# Patient Record
Sex: Male | Born: 1980 | Race: White | Hispanic: No | Marital: Single | Smoking: Never smoker
Health system: Southern US, Community
[De-identification: ages and names within clinical notes are randomized; demographics above are authoritative.]

## PROBLEM LIST (undated history)

## (undated) DIAGNOSIS — I10 Essential (primary) hypertension: Secondary | ICD-10-CM

## (undated) DIAGNOSIS — F1010 Alcohol abuse, uncomplicated: Secondary | ICD-10-CM

## (undated) DIAGNOSIS — F1910 Other psychoactive substance abuse, uncomplicated: Secondary | ICD-10-CM

## (undated) HISTORY — DX: Essential (primary) hypertension: I10

## (undated) HISTORY — PX: NO PAST SURGERIES: SHX2092

---

## 2008-05-13 ENCOUNTER — Emergency Department: Payer: Self-pay | Admitting: Emergency Medicine

## 2015-05-02 ENCOUNTER — Emergency Department: Payer: Self-pay

## 2015-05-02 ENCOUNTER — Emergency Department
Admission: EM | Admit: 2015-05-02 | Discharge: 2015-05-02 | Disposition: A | Discharge status: Home / Self Care | Payer: Self-pay | Attending: Emergency Medicine | Admitting: Emergency Medicine

## 2015-05-02 ENCOUNTER — Encounter: Payer: Self-pay | Admitting: Emergency Medicine

## 2015-05-02 DIAGNOSIS — X58XXXA Exposure to other specified factors, initial encounter: Secondary | ICD-10-CM | POA: Insufficient documentation

## 2015-05-02 DIAGNOSIS — Y9389 Activity, other specified: Secondary | ICD-10-CM | POA: Insufficient documentation

## 2015-05-02 DIAGNOSIS — Y998 Other external cause status: Secondary | ICD-10-CM | POA: Insufficient documentation

## 2015-05-02 DIAGNOSIS — S92355A Nondisplaced fracture of fifth metatarsal bone, left foot, initial encounter for closed fracture: Secondary | ICD-10-CM | POA: Insufficient documentation

## 2015-05-02 DIAGNOSIS — Y9289 Other specified places as the place of occurrence of the external cause: Secondary | ICD-10-CM | POA: Insufficient documentation

## 2015-05-02 DIAGNOSIS — S92301A Fracture of unspecified metatarsal bone(s), right foot, initial encounter for closed fracture: Secondary | ICD-10-CM

## 2015-05-02 MED ORDER — IBUPROFEN 800 MG PO TABS
800.0000 mg | ORAL_TABLET | Freq: Once | ORAL | Status: AC
  Administered 2015-05-02: 800 mg via ORAL
  Filled 2015-05-02: qty 1

## 2015-05-02 MED ORDER — TRAMADOL HCL 50 MG PO TABS: 50.0000 mg | ORAL_TABLET | Freq: Four times a day (QID) | ORAL | Status: DC | PRN

## 2015-05-02 MED ORDER — TRAMADOL HCL 50 MG PO TABS
50.0000 mg | ORAL_TABLET | Freq: Once | ORAL | Status: AC
  Administered 2015-05-02: 50 mg via ORAL
  Filled 2015-05-02: qty 1

## 2015-05-02 MED ORDER — IBUPROFEN 800 MG PO TABS: 800.0000 mg | ORAL_TABLET | Freq: Three times a day (TID) | ORAL | Status: DC | PRN

## 2015-05-02 NOTE — ED Notes (Signed)
States he felt a pop to left foot last pm conts with pain to day

## 2015-05-02 NOTE — ED Provider Notes (Signed)
[facility] Emergency Department Provider Note  ____________________________________________  Time seen: Approximately 1:48 PM  I have reviewed the triage vital signs and the nursing notes.   HISTORY  Chief Complaint Foot Pain    HPI Joseff Luckman is a 34 y.o. male patient complaining of left lateral foot pain since last night. Patient states he felt a pop sensation in the lateral aspect of his foot and since then he's been unable to ambulate without discomfort. Patient state he is able to walk on his heel and any pressure on the lateral aspect of his foot causes increased pain. Patient rates his pain as a 5/10 with ambulation. Described as sharp.   History reviewed. No pertinent past medical history.  There are no active problems to display for this patient.   History reviewed. No pertinent past surgical history.  No current outpatient prescriptions on file.  Allergies Review of patient's allergies indicates no known allergies.  No family history on file.  Social History Social History  Substance Use Topics  . Smoking status: Never Smoker   . Smokeless tobacco: None  . Alcohol Use: No    Review of Systems Constitutional: No fever/chills Eyes: No visual changes. ENT: No sore throat. Cardiovascular: Denies chest pain. Respiratory: Denies shortness of breath. Gastrointestinal: No abdominal pain.  No nausea, no vomiting.  No diarrhea.  No constipation. Genitourinary: Negative for dysuria. Musculoskeletal: Left lateral foot pain. Skin: Negative for rash. Neurological: Negative for headaches, focal weakness or numbness. 10-point ROS otherwise negative.  ____________________________________________   PHYSICAL EXAM:  VITAL SIGNS: ED Triage Vitals  Enc Vitals Group     BP --      Pulse Rate 05/02/15 1340 65     Resp 05/02/15 1340 20     Temp 05/02/15 1340 98 F (36.7 C)     Temp Source 05/02/15 1340 Oral     SpO2 05/02/15 1340 100 %    Weight 05/02/15 1340 165 lb (74.844 kg)     Height 05/02/15 1340  (1.803 m)     Head Cir --      Peak Flow --      Pain Score 05/02/15 1339 5     Pain Loc --      Pain Edu? --      Excl. in GC? --     Constitutional: Alert and oriented. Well appearing and in no acute distress. Eyes: Conjunctivae are normal. PERRL. EOMI. Head: Atraumatic. Nose: No congestion/rhinnorhea. Mouth/Throat: Mucous membranes are moist.  Oropharynx non-erythematous. Neck: No stridor.   Hematological/Lymphatic/Immunilogical: No cervical lymphadenopathy. Cardiovascular: Normal rate, regular rhythm. Grossly normal heart sounds.  Good peripheral circulation. Respiratory: Normal respiratory effort.  No retractions. Lungs CTAB. Gastrointestinal: Soft and nontender. No distention. No abdominal bruits. No CVA tenderness. Musculoskeletal: No obvious deformity. Moderate guarding palpation of the lateral fifth metatarsal head. Patient ambulated atypical gait favoring the left foot.  Neurologic:  Normal speech and language. No gross focal neurologic deficits are appreciated. No gait instability. Skin:  Skin is warm, dry and intact. No rash noted. Psychiatric: Mood and affect are normal. Speech and behavior are normal.  ____________________________________________   LABS (all labs ordered are listed, but only abnormal results are displayed)  Labs Reviewed - No data to display ____________________________________________  EKG   ____________________________________________  RADIOLOGY  Nondisplaced fractured fifth metatarsal left foot. I, Joni Reining, personally viewed and evaluated these images (plain radiographs) as part of my medical decision making.   ____________________________________________   PROCEDURES  Procedure(s) performed: None  Critical Care performed: No  ____________________________________________   INITIAL IMPRESSION / ASSESSMENT AND PLAN / ED COURSE  Pertinent labs &  imaging results that were available during my care of the patient were reviewed by me and considered in my medical decision making (see chart for details).  Nondisplaced left fifth metatarsal fracture. Discussed x-ray findings with patient. Patient placed in an open shoe. Patient given prescription for tramadol and ibuprofen. Patient advised to follow orthopedics in 3-5 days. ____________________________________________   FINAL CLINICAL IMPRESSION(S) / ED DIAGNOSES  Final diagnoses:  Metatarsal fracture, right, closed, initial encounter      Joni Reining, PA-C 05/02/15 1443  Joni Reining, PA-C 05/02/15 1444  Phineas Semen, MD 05/02/15 [phone]

## 2015-05-02 NOTE — ED Notes (Signed)
Felt a pop to top of foot while at work last pm. States pain is worse this am   Min swelling noted

## 2015-05-02 NOTE — Discharge Instructions (Signed)
Wear open shoe until evaluation by Ortho Clinic.

## 2016-03-10 ENCOUNTER — Emergency Department: Payer: Self-pay

## 2016-03-10 ENCOUNTER — Encounter: Payer: Self-pay | Admitting: Emergency Medicine

## 2016-03-10 ENCOUNTER — Emergency Department
Admission: EM | Admit: 2016-03-10 | Discharge: 2016-03-10 | Disposition: A | Discharge status: Home / Self Care | Payer: Self-pay | Attending: Emergency Medicine | Admitting: Emergency Medicine

## 2016-03-10 DIAGNOSIS — Y999 Unspecified external cause status: Secondary | ICD-10-CM | POA: Insufficient documentation

## 2016-03-10 DIAGNOSIS — S93402A Sprain of unspecified ligament of left ankle, initial encounter: Secondary | ICD-10-CM | POA: Insufficient documentation

## 2016-03-10 DIAGNOSIS — Y9289 Other specified places as the place of occurrence of the external cause: Secondary | ICD-10-CM | POA: Insufficient documentation

## 2016-03-10 DIAGNOSIS — X509XXA Other and unspecified overexertion or strenuous movements or postures, initial encounter: Secondary | ICD-10-CM | POA: Insufficient documentation

## 2016-03-10 DIAGNOSIS — Y9389 Activity, other specified: Secondary | ICD-10-CM | POA: Insufficient documentation

## 2016-03-10 MED ORDER — IBUPROFEN 800 MG PO TABS: 800.0000 mg | ORAL_TABLET | Freq: Three times a day (TID) | ORAL | 0 refills | Status: DC | PRN

## 2016-03-10 NOTE — ED Triage Notes (Signed)
Pt reports left ankle pain x1 month, reports he stepped off a curb and pain has not improved.

## 2016-03-10 NOTE — ED Notes (Signed)
Pt was not in room at time for discharge and did not notify staff leaving.

## 2016-03-10 NOTE — ED Provider Notes (Signed)
[facility] Emergency Department Provider Note  ____________________________________________  Time seen: Approximately 11:06 AM  I have reviewed the triage vital signs and the nursing notes.   HISTORY  Chief Complaint Ankle Pain    HPI Billy Wood is a 35 y.o. male presents for evaluation of left ankle pain 1 month. Patient states that he stepped off a curb but denied any insurance to get his ankle checked at that time. Complains of still having swelling and tenderness to the left ankle.   History reviewed. No pertinent past medical history.  There are no active problems to display for this patient.   History reviewed. No pertinent surgical history.  Prior to Admission medications   Medication Sig Start Date End Date Taking? Authorizing Provider  ibuprofen (ADVIL,MOTRIN) 800 MG tablet Take 1 tablet (800 mg total) by mouth every 8 (eight) hours as needed. 03/10/16   Evangeline Dakin, PA-C    Allergies Review of patient's allergies indicates no known allergies.  No family history on file.  Social History Social History  Substance Use Topics  . Smoking status: Never Smoker  . Smokeless tobacco: Not on file  . Alcohol use Yes     Comment: occassional     Review of Systems Constitutional: No fever/chills Cardiovascular: Denies chest pain. Respiratory: Denies shortness of breath. Musculoskeletal: Positive for left ankle pain. Skin: Negative for rash. Neurological: Negative for headaches, focal weakness or numbness.  10-point ROS otherwise negative.  ____________________________________________   PHYSICAL EXAM:  VITAL SIGNS: ED Triage Vitals [03/10/16 1053]  Enc Vitals Group     BP 127/90     Pulse Rate 75     Resp 18     Temp 97.9 F (36.6 C)     Temp Source Oral     SpO2 99 %     Weight 165 lb (74.8 kg)     Height 5\' 11"  (1.803 m)     Head Circumference      Peak Flow      Pain Score      Pain Loc      Pain Edu?      Excl.  in GC?     Constitutional: Alert and oriented. Well appearing and in no acute distress.  Cardiovascular: Normal rate, regular rhythm. Grossly normal heart sounds.  Good peripheral circulation. Respiratory: Normal respiratory effort.  No retractions. Lungs CTAB. Musculoskeletal: Left ankle with obvious edema and swelling noted with some deformity compared to the right. Neurologic:  Normal speech and language. No gross focal neurologic deficits are appreciated. No gait instability. Skin:  Skin is warm, dry and intact. No rash noted. Psychiatric: Mood and affect are normal. Speech and behavior are normal.  ____________________________________________   LABS (all labs ordered are listed, but only abnormal results are displayed)  Labs Reviewed - No data to display ____________________________________________  EKG   ____________________________________________  RADIOLOGY  No acute osseous findings. ____________________________________________   PROCEDURES  Procedure(s) performed: None  Critical Care performed: No  ____________________________________________   INITIAL IMPRESSION / ASSESSMENT AND PLAN / ED COURSE  Pertinent labs & imaging results that were available during my care of the patient were reviewed by me and considered in my medical decision making (see chart for details).  Left ankle sprain. Reassurance provided to the patient Rx given for ibuprofen 800 mg 3 times a day as needed for inflammation and pain. Patient voices no other emergency medical complaints this time.  Clinical Course    ____________________________________________  FINAL CLINICAL IMPRESSION(S) / ED DIAGNOSES  Final diagnoses:  Ankle sprain, right, initial encounter     This chart was dictated using voice recognition software/Dragon. Despite best efforts to proofread, errors can occur which can change the meaning. Any change was purely unintentional.    Evangeline Dakin,  PA-C 03/10/16 1249    Sharyn Creamer, MD 03/10/16 [phone]

## 2017-05-14 ENCOUNTER — Emergency Department
Admission: EM | Admit: 2017-05-14 | Discharge: 2017-05-14 | Disposition: A | Discharge status: Home / Self Care | Payer: Self-pay | Attending: Emergency Medicine | Admitting: Emergency Medicine

## 2017-05-14 ENCOUNTER — Encounter: Payer: Self-pay | Admitting: Emergency Medicine

## 2017-05-14 DIAGNOSIS — K029 Dental caries, unspecified: Secondary | ICD-10-CM | POA: Insufficient documentation

## 2017-05-14 DIAGNOSIS — S025XXB Fracture of tooth (traumatic), initial encounter for open fracture: Secondary | ICD-10-CM | POA: Insufficient documentation

## 2017-05-14 DIAGNOSIS — M5431 Sciatica, right side: Secondary | ICD-10-CM | POA: Insufficient documentation

## 2017-05-14 DIAGNOSIS — Y998 Other external cause status: Secondary | ICD-10-CM | POA: Insufficient documentation

## 2017-05-14 DIAGNOSIS — W010XXA Fall on same level from slipping, tripping and stumbling without subsequent striking against object, initial encounter: Secondary | ICD-10-CM | POA: Insufficient documentation

## 2017-05-14 DIAGNOSIS — Y929 Unspecified place or not applicable: Secondary | ICD-10-CM | POA: Insufficient documentation

## 2017-05-14 DIAGNOSIS — Y9389 Activity, other specified: Secondary | ICD-10-CM | POA: Insufficient documentation

## 2017-05-14 MED ORDER — IBUPROFEN 600 MG PO TABS: 600.0000 mg | ORAL_TABLET | Freq: Four times a day (QID) | ORAL | 0 refills | Status: DC | PRN

## 2017-05-14 MED ORDER — PREDNISONE 10 MG PO TABS: ORAL_TABLET | ORAL | 0 refills | Status: DC

## 2017-05-14 NOTE — ED Provider Notes (Signed)
[facility] Emergency Department Provider Note  ____________________________________________  Time seen: Approximately 2:17 PM  I have reviewed the triage vital signs and the nursing notes.   HISTORY  Chief Complaint Sciatica and Dental Pain    HPI Billy Wood is a 36 y.o. male that presents to the emergency department because he needs help getting temporary Medicaid. Patient states that he has had low left jaw pain for one month. He saw the dentist 2 weeks ago and was told that he needs to see an orthopedic surgeon. He is given a prescription for amoxicillin and just finished. Pain has continued and he does not have the money or the insurance to pay for oral surgeon. He also fell in a ditch 2 weeks ago. He states he was watching his ipad and didn't notice where he was going. He has pain that starts in his right lower back and radiates down the back of his right leg. This feels like sciatic pain that he has had in the past. No bowel or bladder dysfunction or saddle paresthesias. He states that he does not want any medications or testing done. His mouth has hurt too much to go to work because it is too painful to talk. He just wants to know who to talk to about getting Medicaid so he can get his teeth fixed and he can go to work. He states that he called every dentist and clinic and does not know where else to go. No fever, drainage from mouth, shortness breath, chest pain, numbness, tingling.   History reviewed. No pertinent past medical history.  There are no active problems to display for this patient.   History reviewed. No pertinent surgical history.  Prior to Admission medications   Medication Sig Start Date End Date Taking? Authorizing Provider  ibuprofen (ADVIL,MOTRIN) 600 MG tablet Take 1 tablet (600 mg total) by mouth every 6 (six) hours as needed. 05/14/17   Enid Derry, PA-C  predniSONE (DELTASONE) 10 MG tablet Take 6 tablets on day 1, take 5 tablets on  day 2, take 4 tablets on day 3, take 3 tablets on day 4, take 2 tablets on day 5, take 1 tablet on day 6 05/14/17   Enid Derry, PA-C    Allergies Patient has no known allergies.  No family history on file.  Social History Social History  Substance Use Topics  . Smoking status: Never Smoker  . Smokeless tobacco: Never Used  . Alcohol use Yes     Comment: occassional      Review of Systems  Constitutional: No fever/chills Cardiovascular: No chest pain. Respiratory: No SOB. Gastrointestinal: No abdominal pain.  No nausea, no vomiting.  Skin: Negative for rash, abrasions, lacerations, ecchymosis. Neurological: Negative for headaches   ____________________________________________   PHYSICAL EXAM:  VITAL SIGNS: ED Triage Vitals  Enc Vitals Group     BP 05/14/17 1254 132/63     Pulse Rate 05/14/17 1254 87     Resp 05/14/17 1254 16     Temp 05/14/17 1254 98.5 F (36.9 C)     Temp Source 05/14/17 1254 Oral     SpO2 05/14/17 1254 96 %     Weight 05/14/17 1255 160 lb (72.6 kg)     Height 05/14/17 1255  (1.803 m)     Head Circumference --      Peak Flow --      Pain Score 05/14/17 1254 10     Pain Loc --  Pain Edu? --      Excl. in GC? --      Constitutional: Alert and oriented. Well appearing and in no acute distress. Eyes: Conjunctivae are normal. PERRL. EOMI. Head: Atraumatic. ENT:      Ears:      Nose: No congestion/rhinnorhea.      Mouth/Throat: Mucous membranes are moist. Very poor dentition. Multiple broken teeth. No drainage from mouth. No visible swelling. No TMJ pain. Neck: No stridor.  Cardiovascular: Normal rate, regular rhythm.  Good peripheral circulation. Respiratory: Normal respiratory effort without tachypnea or retractions. Lungs CTAB. Good air entry to the bases with no decreased or absent breath sounds. Musculoskeletal: Full range of motion to all extremities. No gross deformities appreciated. No tenderness to palpation over lumbar  spine. Tenderness to palpation over right SI joint. Positive straight leg raise. Strength 5 out of 5 in lower extremities bilaterally. Neurologic:  Normal speech and language. No gross focal neurologic deficits are appreciated.  Skin:  Skin is warm, dry and intact. No rash noted.   ____________________________________________   LABS (all labs ordered are listed, but only abnormal results are displayed)  Labs Reviewed - No data to display ____________________________________________  EKG   ____________________________________________  RADIOLOGY  No results found.  ____________________________________________    PROCEDURES  Procedure(s) performed:    Procedures    Medications - No data to display   ____________________________________________   INITIAL IMPRESSION / ASSESSMENT AND PLAN / ED COURSE  Pertinent labs & imaging results that were available during my care of the patient were reviewed by me and considered in my medical decision making (see chart for details).  Review of the [place] CSRS was performed in accordance of the [facility] prior to dispensing any controlled drugs.     Patient presented to the emergency department with primary concern of getting Medicaid to have his teeth fixed by an oral surgeon. Patient has very poor dentition with multiple dental decay and broken teeth. He just finished a course of amoxicillin. He also has had low back pain for 2 weeks. Symptoms are consistent with sciatica. No bowel or bladder dysfunction or saddle paresthesias. Case manager came to see the patient and discuss options. Patient will be discharged home with prescriptions for prednisone, ibuprofen. Patient is to follow up with PCP as directed. Patient is given ED precautions to return to the ED for any worsening or new symptoms.     ____________________________________________  FINAL CLINICAL IMPRESSION(S) / ED DIAGNOSES  Final diagnoses:  Sciatica of right side  Open  fracture of tooth, initial encounter  Dental caries      NEW MEDICATIONS STARTED DURING THIS VISIT:  Discharge Medication List as of 05/14/2017  2:58 PM    START taking these medications   Details  predniSONE (DELTASONE) 10 MG tablet Take 6 tablets on day 1, take 5 tablets on day 2, take 4 tablets on day 3, take 3 tablets on day 4, take 2 tablets on day 5, take 1 tablet on day 6, Print            This chart was dictated using voice recognition software/Dragon. Despite best efforts to proofread, errors can occur which can change the meaning. Any change was purely unintentional.    Enid Derry, PA-C 05/14/17 1539    Governor Rooks, MD 05/15/17 0830

## 2017-05-14 NOTE — ED Triage Notes (Signed)
Pt in via POV with complaints of right sciatic pain x 2 weeks and left lower dental pain x one month.  Pt reports pain has caused him to miss 2 weeks of works.  Pt ambulatory to triage room without difficulty.  Vitals WDL, NAD noted at this time.

## 2017-05-14 NOTE — ED Notes (Signed)
See triage note  Presents with dental pain to left side of jaw/mouth  States his teeth are broken  Developed increased pain in mouth  Also states he fell in a ditch about 2 weeks ago  conts to have lower back pain

## 2017-05-14 NOTE — Care Management Note (Signed)
Case Management Note  Patient Details  Name: Billy Wood MRN: [id number] Date of Birth: September 13, 1980  Subjective/Objective:   Here to ask if we can complete a Medicaid evaluation for disability due to his back pain. I have provided the patient with the information for e-file and the locall DSS information. He says that he saw DSS last week and told them he was not disabled, so they told him he did not qualify. I have explained to him that he will need to see a community MD about getting tx for his back pain. His reply is that it comes and goes and is sometimes not so bad. He has no other questions at this time.                  Action/Plan:   Expected Discharge Date:                  Expected Discharge Plan:     In-House Referral:     Discharge planning Services     Post Acute Care Choice:    Choice offered to:     DME Arranged:    DME Agency:     HH Arranged:    HH Agency:     Status of Service:     If discussed at [employer] of Stay Meetings, dates discussed:    Additional Comments:  Berna Bue, RN 05/14/2017, 2:39 PM

## 2017-05-14 NOTE — Discharge Instructions (Addendum)
OPTIONS FOR DENTAL FOLLOW UP CARE ° °[place] [facility] - Local Safety Net Dental Clinics °http://www.ncdhhs.gov/dph/oralhealth/services/safetynetclinics.htm °  °[facility] (336-562-3123) ° °[facility] [place] (919-933-9087) ° °[facility] ([phone] ext [phone]) ° °[facility] ([phone]) ° °[facility] ([phone]) °This clinic caters to the indigent population and is on a lottery system. °Location: °[facility], [address], [place] °Clinic Hours: °Wednesdays from 6pm - 9pm, patients seen by a lottery system. °For dates, call or go to [email or web address]/[facility]/patients/[email or web address] °Services: °Cleanings, fillings and simple extractions. °Payment Options: °DENTAL WORK IS FREE OF CHARGE. Bring proof of income or support. °Best way to get seen: °Arrive at 5:15 pm - this is a lottery, NOT first come/first serve, so arriving earlier will not increase your chances of being seen. °  °  °[facility] °[phone] °Select option 1 for emergencies °  °Location: °[facility], [address], [place] °Clinic Hours: °No walk-ins accepted - call the day before to schedule an appointment. °Check in times are 9:30 am and 1:30 pm. °Services: °Simple extractions, temporary fillings, pulpectomy/pulp debridement, uncomplicated abscess drainage. °Payment Options: °PAYMENT IS DUE AT THE TIME OF SERVICE.  Fee is usually $100-200, additional surgical procedures (e.g. abscess drainage) may be extra. °Cash, checks, Visa/MasterCard accepted.  Can file Medicaid if patient is covered for dental - patient should call case worker to check. °No discount for [facility] patients. °Best way to get seen: °MUST call the day before and get onto the schedule. Can usually be seen the next 1-2 days. No walk-ins accepted. °  °  °[facility] °[phone] °   °Location: °[facility], [address], [place] °Clinic Hours: °M, W, Th, F 8am or 1:30pm, Tues 9a or 1:30 - first come/first served. °Services: °Simple extractions, temporary fillings, uncomplicated abscess drainage.  You do not need to be an [place] resident. °Payment Options: °PAYMENT IS DUE AT THE TIME OF SERVICE. °Dental insurance, otherwise sliding scale - bring proof of income or support. °Depending on income and treatment needed, cost is usually $50-200. °Best way to get seen: °Arrive early as it is first come/first served. °  °  °[facility] °[phone] °  °Location: °[address] °Clinic Hours: °Mon-Thu 8a-5p °Services: °Most basic dental services including extractions and fillings. °Payment Options: °PAYMENT IS DUE AT THE TIME OF SERVICE. °Sliding scale, up to 50% off - bring proof if income or support. °Medicaid with dental option accepted. °Best way to get seen: °Call to schedule an appointment, can usually be seen within 2 weeks OR they will try to see walk-ins - show up at 8a or 2p (you may have to wait). °  °  °[facility] °919-245-2435 °[place] RESIDENTS ONLY °  °Location: °[facility], [address], [place], [place] [address] °Clinic Hours: By appointment only. °Monday - Thursday 8am-5pm, Friday 8am-12pm °Services: Cleanings, fillings, extractions. °Payment Options: °PAYMENT IS DUE AT THE TIME OF SERVICE. °Cash, Visa or MasterCard. Sliding scale - $30 minimum per service. °Best way to get seen: °Come in to office, complete packet and make an appointment - need proof of income °or support monies for each household member and proof of [place] residence. °Usually takes about a month to get in. °  °  °[facility] °[phone] °  °Location: °[address]    °Clinic Hours: Walk-in Urgent Care Dental Services are offered Monday-Friday  mornings only. °The numbers of emergencies accepted daily is limited to the number of °providers available. °Maximum 15 - Mondays, Wednesdays & Thursdays °Maximum 10 - Tuesdays & Fridays °Services: °You do not need to be a  [place] resident to be seen for a dental emergency. °Emergencies are defined as pain, swelling, abnormal bleeding, or dental trauma. Walkins will receive x-rays if needed. °NOTE: Dental cleaning is not an emergency. °Payment Options: °PAYMENT IS DUE AT THE TIME OF SERVICE. °Minimum co-pay is $40.00 for uninsured patients. °Minimum co-pay is $3.00 for Medicaid with dental coverage. °Dental Insurance is accepted and must be presented at time of visit. °Medicare does not cover dental. °Forms of payment: Cash, credit card, checks. °Best way to get seen: °If not previously registered with the clinic, walk-in dental registration begins at 7:15 am and is on a first come/first serve basis. °If previously registered with the clinic, call to make an appointment. °  °  °[facility] °[phone] °[place] RESIDENTS ONLY °  °Location: °[address], [place], [place] °Clinic Hours: °Mon-Thu 10a-2p °Services: Extractions only! °Payment Options: °FREE (donations accepted) - bring proof of income or support °Best way to get seen: °Call and schedule an appointment OR come at 8am on the 1st Monday of every month (except for holidays) when it is first come/first served. °  °  °[facility] °[phone] °  °Location: °[address], [place] °Clinic Hours: °Friday mornings °Services, Payment Options, Best way to get seen: °Call for info °

## 2018-09-10 ENCOUNTER — Emergency Department: Payer: No Typology Code available for payment source

## 2018-09-10 ENCOUNTER — Emergency Department
Admission: EM | Admit: 2018-09-10 | Discharge: 2018-09-10 | Disposition: A | Discharge status: Home / Self Care | Payer: No Typology Code available for payment source | Attending: Student in an Organized Health Care Education/Training Program | Admitting: Student in an Organized Health Care Education/Training Program

## 2018-09-10 ENCOUNTER — Encounter: Payer: Self-pay | Admitting: Medical Oncology

## 2018-09-10 DIAGNOSIS — M47812 Spondylosis without myelopathy or radiculopathy, cervical region: Secondary | ICD-10-CM | POA: Diagnosis not present

## 2018-09-10 DIAGNOSIS — Y92481 Parking lot as the place of occurrence of the external cause: Secondary | ICD-10-CM | POA: Insufficient documentation

## 2018-09-10 DIAGNOSIS — Y999 Unspecified external cause status: Secondary | ICD-10-CM | POA: Insufficient documentation

## 2018-09-10 DIAGNOSIS — Y939 Activity, unspecified: Secondary | ICD-10-CM | POA: Diagnosis not present

## 2018-09-10 DIAGNOSIS — M542 Cervicalgia: Secondary | ICD-10-CM | POA: Diagnosis present

## 2018-09-10 MED ORDER — IBUPROFEN 600 MG PO TABS: 600.0000 mg | ORAL_TABLET | Freq: Four times a day (QID) | ORAL | 0 refills | Status: DC | PRN

## 2018-09-10 MED ORDER — CYCLOBENZAPRINE HCL 5 MG PO TABS: ORAL_TABLET | ORAL | 0 refills | Status: DC
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
## 2020-01-11 DIAGNOSIS — F1010 Alcohol abuse, uncomplicated: Secondary | ICD-10-CM | POA: Insufficient documentation

## 2020-01-12 ENCOUNTER — Other Ambulatory Visit: Payer: Self-pay | Admitting: Gerontology

## 2020-01-12 ENCOUNTER — Other Ambulatory Visit (HOSPITAL_COMMUNITY): Payer: Self-pay | Admitting: Gerontology

## 2020-01-12 DIAGNOSIS — R21 Rash and other nonspecific skin eruption: Secondary | ICD-10-CM

## 2020-01-12 DIAGNOSIS — R748 Abnormal levels of other serum enzymes: Secondary | ICD-10-CM

## 2020-01-12 MED ORDER — SENNOSIDES-DOCUSATE SODIUM 8.6-50 MG PO TABS
1.00 | ORAL_TABLET | ORAL | Status: DC
Start: ? — End: 2020-01-12

## 2020-01-12 MED ORDER — GENERIC EXTERNAL MEDICATION
Status: DC
Start: ? — End: 2020-01-12

## 2020-01-12 MED ORDER — ENOXAPARIN SODIUM 40 MG/0.4ML ~~LOC~~ SOLN
40.00 | SUBCUTANEOUS | Status: DC
Start: 2020-01-13 — End: 2020-01-12

## 2020-01-12 MED ORDER — LIDOCAINE HCL 1 % IJ SOLN
0.50 | INTRAMUSCULAR | Status: DC
Start: ? — End: 2020-01-12

## 2020-01-13 ENCOUNTER — Telehealth: Payer: Self-pay | Admitting: Gerontology

## 2020-01-13 DIAGNOSIS — E538 Deficiency of other specified B group vitamins: Secondary | ICD-10-CM | POA: Insufficient documentation

## 2020-01-13 DIAGNOSIS — E7800 Pure hypercholesterolemia, unspecified: Secondary | ICD-10-CM | POA: Insufficient documentation

## 2020-01-13 DIAGNOSIS — E559 Vitamin D deficiency, unspecified: Secondary | ICD-10-CM | POA: Insufficient documentation
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
## 2020-01-16 ENCOUNTER — Telehealth: Payer: Self-pay | Admitting: Gerontology

## 2020-01-16 MED ORDER — MELATONIN 3 MG PO TABS
6.00 | ORAL_TABLET | ORAL | Status: DC
Start: ? — End: 2020-01-16

## 2020-01-16 MED ORDER — GENERIC EXTERNAL MEDICATION
Status: DC
Start: ? — End: 2020-01-16

## 2020-01-16 MED ORDER — TRIAMCINOLONE ACETONIDE 0.1 % EX CREA
TOPICAL_CREAM | CUTANEOUS | Status: DC
Start: 2020-01-14 — End: 2020-01-16

## 2020-01-16 MED ORDER — THIAMINE HCL 100 MG PO TABS
100.00 | ORAL_TABLET | ORAL | Status: DC
Start: ? — End: 2020-01-16

## 2020-01-16 MED ORDER — PREDNISONE 20 MG PO TABS
20.00 | ORAL_TABLET | ORAL | Status: DC
Start: 2020-01-15 — End: 2020-01-16

## 2020-01-16 MED ORDER — CYANOCOBALAMIN 500 MCG PO TABS
1000.00 | ORAL_TABLET | ORAL | Status: DC
Start: 2020-01-15 — End: 2020-01-16
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
## 2020-01-31 ENCOUNTER — Ambulatory Visit
Admission: RE | Admit: 2020-01-31 | Discharge: 2020-01-31 | Disposition: A | Discharge status: Home / Self Care | Payer: No Typology Code available for payment source | Source: Ambulatory Visit | Attending: Gerontology | Admitting: Gerontology

## 2020-01-31 ENCOUNTER — Other Ambulatory Visit: Payer: Self-pay

## 2020-01-31 ENCOUNTER — Other Ambulatory Visit: Payer: Self-pay | Admitting: Gerontology

## 2020-01-31 DIAGNOSIS — R748 Abnormal levels of other serum enzymes: Secondary | ICD-10-CM | POA: Diagnosis present

## 2020-01-31 DIAGNOSIS — R21 Rash and other nonspecific skin eruption: Secondary | ICD-10-CM | POA: Insufficient documentation

## 2020-02-03 DIAGNOSIS — K760 Fatty (change of) liver, not elsewhere classified: Secondary | ICD-10-CM | POA: Insufficient documentation

## 2020-02-20 ENCOUNTER — Ambulatory Visit: Payer: Self-pay | Admitting: Family Medicine

## 2020-03-01 ENCOUNTER — Other Ambulatory Visit: Payer: Self-pay

## 2020-03-01 ENCOUNTER — Ambulatory Visit
Admission: EM | Admit: 2020-03-01 | Discharge: 2020-03-01 | Disposition: A | Discharge status: Home / Self Care | Payer: No Typology Code available for payment source | Attending: Family Medicine | Admitting: Family Medicine

## 2020-03-01 DIAGNOSIS — K0889 Other specified disorders of teeth and supporting structures: Secondary | ICD-10-CM | POA: Diagnosis not present

## 2020-03-01 DIAGNOSIS — R21 Rash and other nonspecific skin eruption: Secondary | ICD-10-CM

## 2020-03-01 MED ORDER — AMOXICILLIN-POT CLAVULANATE 875-125 MG PO TABS: 1.0000 | ORAL_TABLET | Freq: Two times a day (BID) | ORAL | 0 refills | Status: DC

## 2020-03-01 MED ORDER — KETOROLAC TROMETHAMINE 10 MG PO TABS: 10.0000 mg | ORAL_TABLET | Freq: Four times a day (QID) | ORAL | 0 refills | Status: DC | PRN

## 2020-03-01 NOTE — ED Triage Notes (Signed)
Patient states that he has dental pain that has been ongoing for a bit but worsened here recently. States that he has been unable to eat in 2 days, reports that he believes he needs antibiotic.   Also complains of a rash to his arms that occurred after his 2nd Covid Vaccine. PCP placed him on a psorasis cream but does not seem to be helping him.

## 2020-03-01 NOTE — Discharge Instructions (Signed)
Medication as prescribed.  Please see a dentist as soon as you can.  Ask you PCP for a referral to dermatology given persistent rash.  Take care  Dr. Adriana Simas

## 2020-03-01 NOTE — ED Provider Notes (Signed)
[facility]    CSN: [id number] Arrival date & time: 03/01/20  1612      History   Chief Complaint Chief Complaint  Patient presents with  . Dental Pain  . Rash   HPI  39 year old male presents with the above complaints.  Patient has had an ongoing rash for months.  Has not improved with prescribed treatment.  Has not seen dermatology.  Patient's primary concern is his dental pain.  Has been worsening over the past 2 days.  He has had this previously.  He states that he has poor dentition.  He localizes the pain to the left lower last molar.  He states that he does not have money to see a dentist.  Pain 8/10 in severity.  Described as throbbing and constant.  No relieving factors.  No other complaints.  Home Medications    Prior to Admission medications   Medication Sig Start Date End Date Taking? Authorizing Provider  amoxicillin-clavulanate (AUGMENTIN) 875-125 MG tablet Take 1 tablet by mouth 2 (two) times daily. 03/01/20   Tommie Sams, DO  ketorolac (TORADOL) 10 MG tablet Take 1 tablet (10 mg total) by mouth every 6 (six) hours as needed for moderate pain or severe pain. 03/01/20   Tommie Sams, DO    Family History Family History  Problem Relation Age of Onset  . Healthy Mother   . Healthy Father     Social History Social History   Tobacco Use  . Smoking status: Never Smoker  . Smokeless tobacco: Never Used  Vaping Use  . Vaping Use: Never used  Substance Use Topics  . Alcohol use: Yes    Comment: occasional   . Drug use: No     Allergies   Patient has no known allergies.   Review of Systems Review of Systems  HENT: Positive for dental problem.   Skin: Positive for rash.   Physical Exam Triage Vital Signs ED Triage Vitals  Enc Vitals Group     BP 03/01/20 1629 (!) 132/92     Pulse Rate 03/01/20 1629 78     Resp 03/01/20 1629 18     Temp 03/01/20 1629 97.8 F (36.6 C)     Temp Source 03/01/20 1629 Oral     SpO2 03/01/20 1629 99 %       Weight 03/01/20 1627 165 lb (74.8 kg)     Height 03/01/20 1627 5\' 11"  (1.803 m)     Head Circumference --      Peak Flow --      Pain Score 03/01/20 1626 8     Pain Loc --      Pain Edu? --      Excl. in GC? --    Updated Vital Signs BP (!) 132/92 (BP Location: Left Arm)   Pulse 78   Temp 97.8 F (36.6 C) (Oral)   Resp 18   Ht 5\' 11"  (1.803 m)   Wt 74.8 kg   SpO2 99%   BMI 23.01 kg/m   Visual Acuity Right Eye Distance:   Left Eye Distance:   Bilateral Distance:    Right Eye Near:   Left Eye Near:    Bilateral Near:     Physical Exam Constitutional:      General: He is not in acute distress.    Appearance: Normal appearance. He is not ill-appearing.  HENT:     Head: Normocephalic and atraumatic.     Mouth/Throat:  Comments: Labeled teeth are black and decayed.  Exquisitely tender to palpation. Eyes:     General:        Right eye: No discharge.        Left eye: No discharge.     Conjunctiva/sclera: Conjunctivae normal.  Pulmonary:     Effort: Pulmonary effort is normal. No respiratory distress.  Skin:    Comments: Diffuse, slightly raised erythematous rash.  Neurological:     Mental Status: He is alert.  Psychiatric:        Mood and Affect: Mood normal.        Behavior: Behavior normal.    UC Treatments / Results  Labs (all labs ordered are listed, but only abnormal results are displayed) Labs Reviewed - No data to display  EKG   Radiology No results found.  Procedures Procedures (including critical care time)  Medications Ordered in UC Medications - No data to display  Initial Impression / Assessment and Plan / UC Course  I have reviewed the triage vital signs and the nursing notes.  Pertinent labs & imaging results that were available during my care of the patient were reviewed by me and considered in my medical decision making (see chart for details).    39 year old male presents with dental pain and rash.  Patient has poor  dentition throughout.  Placing on Augmentin to cover for possible dental infection.  Toradol as needed for pain.  Regarding his rash, I advised him to get a referral from his primary care physician to dermatology for biopsy given lack of improvement with treatment and persistence.  Final Clinical Impressions(s) / UC Diagnoses   Final diagnoses:  Pain, dental  Rash     Discharge Instructions     Medication as prescribed.  Please see a dentist as soon as you can.  Ask you PCP for a referral to dermatology given persistent rash.  Take care  Dr. Adriana Simas    ED Prescriptions    Medication Sig Dispense Auth. Provider   amoxicillin-clavulanate (AUGMENTIN) 875-125 MG tablet Take 1 tablet by mouth 2 (two) times daily. 20 tablet Emerald Shor G, DO   ketorolac (TORADOL) 10 MG tablet Take 1 tablet (10 mg total) by mouth every 6 (six) hours as needed for moderate pain or severe pain. 20 tablet Tommie Sams, DO     PDMP not reviewed this encounter.   Tommie Sams, [place] 03/01/20 1719

## 2020-05-01 DIAGNOSIS — G621 Alcoholic polyneuropathy: Secondary | ICD-10-CM | POA: Insufficient documentation

## 2020-05-05 DIAGNOSIS — R7989 Other specified abnormal findings of blood chemistry: Secondary | ICD-10-CM | POA: Insufficient documentation

## 2020-11-27 ENCOUNTER — Ambulatory Visit (INDEPENDENT_AMBULATORY_CARE_PROVIDER_SITE_OTHER): Payer: No Typology Code available for payment source

## 2020-11-27 ENCOUNTER — Other Ambulatory Visit: Payer: Self-pay

## 2020-11-27 ENCOUNTER — Ambulatory Visit
Admission: EM | Admit: 2020-11-27 | Discharge: 2020-11-27 | Disposition: A | Discharge status: Home / Self Care | Payer: No Typology Code available for payment source | Attending: Emergency Medicine | Admitting: Emergency Medicine

## 2020-11-27 DIAGNOSIS — W19XXXA Unspecified fall, initial encounter: Secondary | ICD-10-CM | POA: Diagnosis not present

## 2020-11-27 DIAGNOSIS — M25561 Pain in right knee: Secondary | ICD-10-CM

## 2020-11-27 DIAGNOSIS — M25461 Effusion, right knee: Secondary | ICD-10-CM | POA: Diagnosis not present

## 2020-11-27 MED ORDER — IBUPROFEN 600 MG PO TABS: 600.0000 mg | ORAL_TABLET | Freq: Four times a day (QID) | ORAL | 0 refills | Status: DC | PRN

## 2020-11-27 NOTE — ED Provider Notes (Signed)
[facility]    CSN: [id number] Arrival date & time: 11/27/20  1559      History   Chief Complaint Chief Complaint  Patient presents with  . Knee Pain    right    HPI Billy Wood is a 40 y.o. male.   HPI   40 year old male here for evaluation of right knee pain.  Patient reports that he slipped and fell on a hardwood floor 5 days ago and landed directly on his kneecap.  Patient works that he has had pain ever since and the pain increases when he is ambulating.  Patient states that he never had any swelling or bruising.  Patient ports that he has been keeping his leg elevated and applying ice to it.  He denies numbness or tingling.  Patient states that he has not been able to work this week due to the pain.  History reviewed. No pertinent past medical history.  There are no problems to display for this patient.   History reviewed. No pertinent surgical history.     Home Medications    Prior to Admission medications   Medication Sig Start Date End Date Taking? Authorizing Provider  ibuprofen (ADVIL) 600 MG tablet Take 1 tablet (600 mg total) by mouth every 6 (six) hours as needed. 11/27/20  Yes Becky Augusta, NP    Family History Family History  Problem Relation Age of Onset  . Healthy Mother   . Healthy Father     Social History Social History   Tobacco Use  . Smoking status: Never Smoker  . Smokeless tobacco: Never Used  Vaping Use  . Vaping Use: Never used  Substance Use Topics  . Alcohol use: Yes    Comment: occasional   . Drug use: No     Allergies   Patient has no known allergies.   Review of Systems Review of Systems  Constitutional: Negative for activity change, appetite change and fever.  Musculoskeletal: Positive for arthralgias and myalgias. Negative for joint swelling.  Skin: Negative for color change.  Neurological: Negative for weakness and numbness.  Hematological: Negative.      Physical Exam Triage Vital Signs ED  Triage Vitals  Enc Vitals Group     BP 11/27/20 1609 (!) 149/99     Pulse Rate 11/27/20 1609 88     Resp 11/27/20 1609 18     Temp 11/27/20 1609 98.3 F (36.8 C)     Temp Source 11/27/20 1609 Oral     SpO2 11/27/20 1609 99 %     Weight 11/27/20 1607 155 lb (70.3 kg)     Height 11/27/20 1607 5\' 11"  (1.803 m)     Head Circumference --      Peak Flow --      Pain Score 11/27/20 1607 8     Pain Loc --      Pain Edu? --      Excl. in GC? --    No data found.  Updated Vital Signs BP (!) 149/99 (BP Location: Left Arm)   Pulse 88   Temp 98.3 F (36.8 C) (Oral)   Resp 18   Ht 5\' 11"  (1.803 m)   Wt 155 lb (70.3 kg)   SpO2 99%   BMI 21.62 kg/m   Visual Acuity Right Eye Distance:   Left Eye Distance:   Bilateral Distance:    Right Eye Near:   Left Eye Near:    Bilateral Near:     Physical Exam Vitals  and nursing note reviewed.  Constitutional:      General: He is not in acute distress.    Appearance: Normal appearance. He is normal weight. He is not ill-appearing.  HENT:     Head: Normocephalic and atraumatic.  Musculoskeletal:        General: Swelling and tenderness present. No deformity. Normal range of motion.  Skin:    General: Skin is warm and dry.     Capillary Refill: Capillary refill takes less than 2 seconds.     Findings: No bruising or erythema.  Neurological:     General: No focal deficit present.     Mental Status: He is alert and oriented to person, place, and time.  Psychiatric:        Mood and Affect: Mood normal.        Behavior: Behavior normal.        Thought Content: Thought content normal.        Judgment: Judgment normal.      UC Treatments / Results  Labs (all labs ordered are listed, but only abnormal results are displayed) Labs Reviewed - No data to display  EKG   Radiology DG Knee Complete 4 Views Right  Result Date: 11/27/2020 CLINICAL DATA:  Fall.  Pain and swelling. EXAM: RIGHT KNEE - COMPLETE 4+ VIEW COMPARISON:  No prior.  FINDINGS: Prepatellar soft tissue swelling. No significant effusion. Deformity noted of the proximal fibula. Although this may be related to prior fracture, MRI of the right knee is suggested to exclude an expansile lesion. Mild medial compartment degenerative change. Small bony density noted along the posterior tibia most likely a small osteophyte. No acute bony or joint abnormality. No evidence of acute fracture or dislocation. IMPRESSION: 1. Prepatellar soft tissue swelling. No evidence of acute abnormality. 2. Deformity noted of the proximal fibula. Although this may be related to prior fracture, MRI of the right knee is suggested to exclude an expansile lesion. 3. Mild medial compartment degenerative change. Small bony density noted along the posterior aspect of the tibia most likely a small osteophyte. Electronically Signed   By: Maisie Fus  Register   On: 11/27/2020 17:01    Procedures Procedures (including critical care time)  Medications Ordered in UC Medications - No data to display  Initial Impression / Assessment and Plan / UC Course  I have reviewed the triage vital signs and the nursing notes.  Pertinent labs & imaging results that were available during my care of the patient were reviewed by me and considered in my medical decision making (see chart for details).   Patient is here for evaluation of pain in his right knee x5 days after falling on a hardwood floor and landing on his kneecap.  Patient reports that his pain is there in the kneecap and the backside of his knee.  There is no ecchymosis, erythema, or or induration noted.  Patient has no tenderness when palpating the medial or lateral joint line.  Patient does have tenderness when palpating over the patella but no crepitus.  There is no tenderness to the tibial tuberosity or to the popliteal fossa.  No tenderness with varus valgus stress.  Patient does appear to have swelling to the right patella and difficulty achieving full  extension both with passive and active range of motion.  Will obtain radiograph of right knee.  Radiology interpretation of knee x-ray is there is prepatellar soft tissue swelling formerly to the proximal fibula that may possibly be an old fracture.  Patient is not having any tenderness to his fibula.  Will treat patient's prepatellar swelling with NSAIDs, apply a better fitting knee brace for stability, have patient continue elevation and add moist heat to promote wound healing.  Work note provided.   Final Clinical Impressions(s) / UC Diagnoses   Final diagnoses:  Acute pain of right knee     Discharge Instructions     Wear the knee brace to provide increased ability to your knee and your kneecap as you continue to heal.  Keep your right leg elevated as much as possible to help decrease swelling.  Take the ibuprofen, 600 mg every 6 hours with food, as needed for pain and inflammation.  Apply moist heat to your knee for 20 minutes at a time 2-3 times a day to help improve blood flow and facilitate resolution of your swelling.  Return for reevaluation for any new or worsening symptoms.  Or follow-up with [facility].    ED Prescriptions    Medication Sig Dispense Auth. Provider   ibuprofen (ADVIL) 600 MG tablet Take 1 tablet (600 mg total) by mouth every 6 (six) hours as needed. 30 tablet Becky Augusta, NP     I have reviewed the PDMP during this encounter.   Becky Augusta, NP 11/27/20 1713

## 2020-11-27 NOTE — ED Triage Notes (Signed)
Pt c/o pain to right knee after falling on the floor several days ago. Pt has been using elevation and ice to help with symptoms. Pt denies any swelling or bruising. Pt reports pain with ambulation/movement. Pt has not been to work this week due to the pain.

## 2020-11-27 NOTE — Discharge Instructions (Addendum)
Wear the knee brace to provide increased ability to your knee and your kneecap as you continue to heal.  Keep your right leg elevated as much as possible to help decrease swelling.  Take the ibuprofen, 600 mg every 6 hours with food, as needed for pain and inflammation.  Apply moist heat to your knee for 20 minutes at a time 2-3 times a day to help improve blood flow and facilitate resolution of your swelling.  Return for reevaluation for any new or worsening symptoms.  Or follow-up with [facility].

## 2021-04-08 ENCOUNTER — Other Ambulatory Visit: Payer: Self-pay

## 2021-04-08 ENCOUNTER — Emergency Department: Payer: No Typology Code available for payment source

## 2021-04-08 ENCOUNTER — Inpatient Hospital Stay
Admission: EM | Admit: 2021-04-08 | Discharge: 2021-04-11 | DRG: 897 | Disposition: A | Discharge status: Home / Self Care | Payer: No Typology Code available for payment source | Attending: Family Medicine | Admitting: Family Medicine

## 2021-04-08 DIAGNOSIS — R030 Elevated blood-pressure reading, without diagnosis of hypertension: Secondary | ICD-10-CM | POA: Diagnosis present

## 2021-04-08 DIAGNOSIS — F10229 Alcohol dependence with intoxication, unspecified: Secondary | ICD-10-CM | POA: Diagnosis present

## 2021-04-08 DIAGNOSIS — E876 Hypokalemia: Secondary | ICD-10-CM | POA: Diagnosis present

## 2021-04-08 DIAGNOSIS — D6959 Other secondary thrombocytopenia: Secondary | ICD-10-CM | POA: Diagnosis present

## 2021-04-08 DIAGNOSIS — R7989 Other specified abnormal findings of blood chemistry: Secondary | ICD-10-CM | POA: Diagnosis not present

## 2021-04-08 DIAGNOSIS — F10939 Alcohol use, unspecified with withdrawal, unspecified: Secondary | ICD-10-CM | POA: Diagnosis present

## 2021-04-08 DIAGNOSIS — F10239 Alcohol dependence with withdrawal, unspecified: Secondary | ICD-10-CM | POA: Diagnosis present

## 2021-04-08 DIAGNOSIS — R569 Unspecified convulsions: Secondary | ICD-10-CM

## 2021-04-08 DIAGNOSIS — Z20822 Contact with and (suspected) exposure to covid-19: Secondary | ICD-10-CM | POA: Diagnosis present

## 2021-04-08 DIAGNOSIS — D696 Thrombocytopenia, unspecified: Secondary | ICD-10-CM

## 2021-04-08 DIAGNOSIS — R270 Ataxia, unspecified: Secondary | ICD-10-CM | POA: Diagnosis present

## 2021-04-08 DIAGNOSIS — F1020 Alcohol dependence, uncomplicated: Secondary | ICD-10-CM | POA: Diagnosis not present

## 2021-04-08 DIAGNOSIS — F10230 Alcohol dependence with withdrawal, uncomplicated: Secondary | ICD-10-CM | POA: Diagnosis not present

## 2021-04-08 DIAGNOSIS — K7010 Alcoholic hepatitis without ascites: Secondary | ICD-10-CM | POA: Diagnosis present

## 2021-04-08 HISTORY — DX: Other psychoactive substance abuse, uncomplicated: F19.10

## 2021-04-08 HISTORY — DX: Unspecified convulsions: R56.9

## 2021-04-08 LAB — BASIC METABOLIC PANEL
Anion gap: 17 — ABNORMAL HIGH (ref 5–15)
BUN: 7 mg/dL (ref 6–20)
CO2: 23 mmol/L (ref 22–32)
Calcium: 9.8 mg/dL (ref 8.9–10.3)
Chloride: 96 mmol/L — ABNORMAL LOW (ref 98–111)
Creatinine, Ser: 0.77 mg/dL (ref 0.61–1.24)
GFR, Estimated: >60 mL/min (ref >60)
Glucose, Bld: 190 mg/dL — ABNORMAL HIGH (ref 70–99)
Potassium: 3.4 mmol/L — ABNORMAL LOW (ref 3.5–5.1)
Sodium: 136 mmol/L (ref 135–145)

## 2021-04-08 LAB — HEPATIC FUNCTION PANEL
ALT: 90 U/L — ABNORMAL HIGH (ref 0–44)
AST: 158 U/L — ABNORMAL HIGH (ref 15–41)
Albumin: 4.6 g/dL (ref 3.5–5.0)
Alkaline Phosphatase: 71 U/L (ref 38–126)
Bilirubin, Direct: 0.5 mg/dL — ABNORMAL HIGH (ref 0.0–0.2)
Indirect Bilirubin: 1.4 mg/dL — ABNORMAL HIGH (ref 0.3–0.9)
Total Bilirubin: 1.9 mg/dL — ABNORMAL HIGH (ref 0.3–1.2)
Total Protein: 7.8 g/dL (ref 6.5–8.1)

## 2021-04-08 LAB — RESP PANEL BY RT-PCR (FLU A&B, COVID) ARPGX2
Influenza A by PCR: NEGATIVE
Influenza B by PCR: NEGATIVE
SARS Coronavirus 2 by RT PCR: NEGATIVE

## 2021-04-08 LAB — CBC
HCT: 40.6 % (ref 39.0–52.0)
Hemoglobin: 14.9 g/dL (ref 13.0–17.0)
MCH: 33.8 pg (ref 26.0–34.0)
MCHC: 36.7 g/dL — ABNORMAL HIGH (ref 30.0–36.0)
MCV: 92.1 fL (ref 80.0–100.0)
Platelets: 93 10*3/uL — ABNORMAL LOW (ref 150–400)
RBC: 4.41 MIL/uL (ref 4.22–5.81)
RDW: 12 % (ref 11.5–15.5)
WBC: 6.7 10*3/uL (ref 4.0–10.5)
nRBC: 0 % (ref 0.0–0.2)

## 2021-04-08 LAB — LIPASE, BLOOD: Lipase: 65 U/L — ABNORMAL HIGH (ref 11–51)

## 2021-04-08 LAB — ETHANOL: Alcohol, Ethyl (B): <10 mg/dL (ref <10)

## 2021-04-08 LAB — MAGNESIUM: Magnesium: 1.7 mg/dL (ref 1.7–2.4)

## 2021-04-08 MED ORDER — CHLORDIAZEPOXIDE HCL 25 MG PO CAPS
25.0000 mg | ORAL_CAPSULE | Freq: Once | ORAL | Status: AC
  Administered 2021-04-08: 25 mg via ORAL
  Filled 2021-04-08: qty 1

## 2021-04-08 MED ORDER — ONDANSETRON HCL 4 MG/2ML IJ SOLN: 4.0000 mg | Freq: Four times a day (QID) | INTRAMUSCULAR | Status: DC | PRN

## 2021-04-08 MED ORDER — LORAZEPAM 2 MG/ML IJ SOLN: 0.0000 mg | Freq: Two times a day (BID) | INTRAMUSCULAR | Status: DC

## 2021-04-08 MED ORDER — LORAZEPAM 2 MG PO TABS: 0.0000 mg | ORAL_TABLET | Freq: Two times a day (BID) | ORAL | Status: DC

## 2021-04-08 MED ORDER — THIAMINE HCL 100 MG/ML IJ SOLN
500.0000 mg | Freq: Once | INTRAMUSCULAR | Status: AC
  Administered 2021-04-08: 500 mg via INTRAMUSCULAR
  Filled 2021-04-08: qty 6

## 2021-04-08 MED ORDER — POTASSIUM CHLORIDE 20 MEQ PO PACK
40.0000 meq | PACK | Freq: Once | ORAL | Status: AC
  Administered 2021-04-09: 40 meq via ORAL
  Filled 2021-04-08: qty 2

## 2021-04-08 MED ORDER — SODIUM CHLORIDE 0.9 % IV SOLN: INTRAVENOUS | Status: DC

## 2021-04-08 MED ORDER — ACETAMINOPHEN 650 MG RE SUPP: 650.0000 mg | Freq: Four times a day (QID) | RECTAL | Status: DC | PRN

## 2021-04-08 MED ORDER — ACETAMINOPHEN 325 MG PO TABS
650.0000 mg | ORAL_TABLET | Freq: Four times a day (QID) | ORAL | Status: DC | PRN
  Administered 2021-04-09: 650 mg via ORAL
  Filled 2021-04-08: qty 2

## 2021-04-08 MED ORDER — TRAZODONE HCL 50 MG PO TABS
25.0000 mg | ORAL_TABLET | Freq: Every evening | ORAL | Status: DC | PRN
  Administered 2021-04-09: 25 mg via ORAL
  Filled 2021-04-08: qty 1

## 2021-04-08 MED ORDER — ADULT MULTIVITAMIN W/MINERALS CH
1.0000 | ORAL_TABLET | Freq: Once | ORAL | Status: AC
  Administered 2021-04-08: 1 via ORAL
  Filled 2021-04-08: qty 1

## 2021-04-08 MED ORDER — MAGNESIUM HYDROXIDE 400 MG/5ML PO SUSP
30.0000 mL | Freq: Every day | ORAL | Status: DC | PRN
  Filled 2021-04-08: qty 30

## 2021-04-08 MED ORDER — LACTATED RINGERS IV BOLUS
1000.0000 mL | Freq: Once | INTRAVENOUS | Status: AC
  Administered 2021-04-08: 1000 mL via INTRAVENOUS

## 2021-04-08 MED ORDER — MAGNESIUM SULFATE 2 GM/50ML IV SOLN
2.0000 g | Freq: Once | INTRAVENOUS | Status: AC
  Administered 2021-04-09: 2 g via INTRAVENOUS
  Filled 2021-04-08: qty 50

## 2021-04-08 MED ORDER — LORAZEPAM 2 MG/ML IJ SOLN
0.0000 mg | Freq: Four times a day (QID) | INTRAMUSCULAR | Status: AC
Start: 2021-04-08 — End: 2021-04-10
  Administered 2021-04-10: 1 mg via INTRAVENOUS
  Filled 2021-04-08: qty 1

## 2021-04-08 MED ORDER — LORAZEPAM 2 MG PO TABS
0.0000 mg | ORAL_TABLET | Freq: Four times a day (QID) | ORAL | Status: AC
  Administered 2021-04-09 – 2021-04-10 (×2): 1 mg via ORAL
  Filled 2021-04-08 (×4): qty 1

## 2021-04-08 MED ORDER — ONDANSETRON HCL 4 MG PO TABS: 4.0000 mg | ORAL_TABLET | Freq: Four times a day (QID) | ORAL | Status: DC | PRN

## 2021-04-08 NOTE — ED Notes (Signed)
Pt reports in WR to RN that he feels if he is going to have a seizure. Pt beginning to vomit. Pt taken to room 18 with seizure pads in place and monitor placed.

## 2021-04-08 NOTE — H&P (Signed)
[place]   PATIENT NAME: Billy Wood    MR#:  [id number]  DATE OF BIRTH:  September 23, 1980  DATE OF ADMISSION:  04/08/2021  PRIMARY CARE PHYSICIAN: Lorenso Quarry, NP   Patient is coming from: Home  REQUESTING/REFERRING PHYSICIAN: Delton Prairie, MD  CHIEF COMPLAINT:   Chief Complaint  Patient presents with  . Seizures    HISTORY OF PRESENT ILLNESS:  Billy Wood is a 40 y.o. Caucasian male with medical history significant for ongoing alcohol abuse and dependence as well as alcohol withdrawal seizure once about a year ago, who presented to the emergency room with acute onset of seizure that was witnessed by his girlfriend.  She described it as shaking all over without tongue bites.  He had no urinary or stool incontinence.  His girlfriend had postictal confusion.  He admitted to tactile fever and chills.  She has had recurrent nausea and vomiting today without diarrhea.  He has been having oliguria stating that he has not been able to urinate all day.  He admits to occasional dyspnea but denied any cough or wheezing.  No chest pain or palpitations.  He has chronic bilateral feet tingling and admits to headache.  No other neurological deficits.  ED Course: Upon presentation to the ER, blood pressure was 163/100 with respiratory to 23.  Labs revealed mild hypokalemia of 3.4 and otherwise blood glucose was 190 with a magnesium of 1.7 and AST 158 ALT 90 and indirect bili of 1.4 with total bili of 1.9.  Serum lipase was 65.  CBC was remarkable for  thrombocytopenia of 93.  Alcohol level was less than 10.  Influenza antigens and COVID-19 PCR came back negative. EKG as reviewed by me : Showed sinus rhythm with rate of 87 with borderline right axis deviation. Imaging: Noncontrast head CT scan was normal.  The patient was given 25 mg of p.o. Librium and 1 L bolus of IV lactated Ringer as well as multivitamins, 500 mg of IM thiamine and was placed on CIWA protocol.  He will be admitted to a  progressive unit bed for further evaluation and management. PAST MEDICAL HISTORY:  1.  Alcohol abuse and dependence. 2.  Alcohol withdrawal seizure.  PAST SURGICAL HISTORY:  History reviewed. No pertinent surgical history.  He denies any previous surgeries.  SOCIAL HISTORY:   Social History   Tobacco Use  . Smoking status: Never  . Smokeless tobacco: Never  Substance Use Topics  . Alcohol use: Yes    Comment: occasional     FAMILY HISTORY:   Family History  Problem Relation Age of Onset  . Healthy Mother   . Healthy Father     DRUG ALLERGIES:  No Known Allergies  REVIEW OF SYSTEMS:   ROS As per history of present illness. All pertinent systems were reviewed above. Constitutional, HEENT, cardiovascular, respiratory, GI, GU, musculoskeletal, neuro, psychiatric, endocrine, integumentary and hematologic systems were reviewed and are otherwise negative/unremarkable except for positive findings mentioned above in the HPI.   MEDICATIONS AT HOME:   Prior to Admission medications   Medication Sig Start Date End Date Taking? Authorizing Provider  ibuprofen (ADVIL) 600 MG tablet Take 1 tablet (600 mg total) by mouth every 6 (six) hours as needed. Patient not taking: Reported on 04/08/2021 11/27/20   Becky Augusta, NP      VITAL SIGNS:  Blood pressure (!) 139/91, pulse 92, temperature 98.6 F (37 C), temperature source Oral, resp. rate 16, height 5\' 11"  (1.803 m),  weight 63.5 kg, SpO2 98 %.  PHYSICAL EXAMINATION:  Physical Exam  GENERAL:  40 y.o.-year-old Caucasian male patient lying in the bed with no acute distress.  EYES: Pupils equal, round, reactive to light and accommodation. No scleral icterus. Extraocular muscles intact.  HEENT: Head atraumatic, normocephalic. Oropharynx and nasopharynx clear.  NECK:  Supple, no jugular venous distention. No thyroid enlargement, no tenderness.  LUNGS: Normal breath sounds bilaterally, no wheezing, rales,rhonchi or crepitation. No use  of accessory muscles of respiration.  CARDIOVASCULAR: Regular rate and rhythm, S1, S2 normal. No murmurs, rubs, or gallops.  ABDOMEN: Soft, nondistended, nontender. Bowel sounds present. No organomegaly or mass.  EXTREMITIES: No pedal edema, cyanosis, or clubbing.  NEUROLOGIC: Cranial nerves II through XII are intact. Muscle strength 5/5 in all extremities. Sensation intact. Gait not checked.  PSYCHIATRIC: The patient is alert and oriented x 3.  Normal affect and good eye contact. SKIN: Scattered ecchymotic patches over areas lower lower extremities.  No other obvious rash, lesion, or ulcer.   LABORATORY PANEL:   CBC Recent Labs  Lab 04/08/21 1935  WBC 6.7  HGB 14.9  HCT 40.6  PLT 93*   ------------------------------------------------------------------------------------------------------------------  Chemistries  Recent Labs  Lab 04/08/21 1935  NA 136  K 3.4*  CL 96*  CO2 23  GLUCOSE 190*  BUN 7  CREATININE 0.77  CALCIUM 9.8  MG 1.7  AST 158*  ALT 90*  ALKPHOS 71  BILITOT 1.9*   ------------------------------------------------------------------------------------------------------------------  Cardiac Enzymes No results for input(s): TROPONINI in the last 168 hours. ------------------------------------------------------------------------------------------------------------------  RADIOLOGY:  CT HEAD WO CONTRAST ( )  Result Date: 04/08/2021 CLINICAL DATA:  Dizziness and head trauma EXAM: CT HEAD WITHOUT CONTRAST TECHNIQUE: Contiguous axial images were obtained from the base of the skull through the vertex without intravenous contrast. COMPARISON:  None. FINDINGS: Brain: There is no mass, hemorrhage or extra-axial collection. The size and configuration of the ventricles and extra-axial CSF spaces are normal. The brain parenchyma is normal, without acute or chronic infarction. Vascular: No abnormal hyperdensity of the major intracranial arteries or dural venous sinuses.  No intracranial atherosclerosis. Skull: The visualized skull base, calvarium and extracranial soft tissues are normal. Sinuses/Orbits: No fluid levels or advanced mucosal thickening of the visualized paranasal sinuses. No mastoid or middle ear effusion. The orbits are normal. IMPRESSION: Normal head CT. Electronically Signed   By: Deatra Robinson M.D.   On: 04/08/2021 21:07      IMPRESSION AND PLAN:  Active Problems:   Alcohol withdrawal seizure (HCC)  1.  Seizure likely alcohol withdrawal. - The patient will admit to a progressive unit bed. - He will be placed on seizures precautions. - We will place him.  IV Ativan for seizures. - We will obtain a neurology consultation. - I notified Dr. Amada Jupiter about the patient.  2.  Alcohol dependence and current withdrawal. - The patient will be placed on a banana bag daily. - CIWA protocol will be followed. - The patient is agreeable with alcohol detox. - I counseled him for cessation and engaging with AA groups after discharge.  3.  Elevated blood pressure. - This like secondary to as seizure. - We will place on as needed IV labetalol and monitor BP.  4.  Hypokalemia and borderline magnesium. - We will optimize potassium and magnesium.  5.  Elevated LFTs like secondary to alcoholic hepatitis. - We will follow LFTs with hydration.  6.  Slightly elevated lipase level. - The patient's been having nausea and vomiting earlier today.  This could be related to early pancreatitis. - We will keep him n.p.o. overnight and follow lipase level.  DVT prophylaxis: SCDs.  Medical prophylaxis currently contraindicated due to thrombocytopenia.  Code Status: full code. Family Communication:  The plan of care was discussed in details with the patient (and family). I answered all questions. The patient agreed to proceed with the above mentioned plan. Further management will depend upon hospital course. Disposition Plan: Back to previous home  environment Consults called: Neurology. All the records are reviewed and case discussed with ED provider.  Status is: Inpatient  Remains inpatient appropriate because:Altered mental status, Ongoing diagnostic testing needed not appropriate for outpatient work up, Unsafe d/c plan, IV treatments appropriate due to intensity of illness or inability to take PO, and Inpatient level of care appropriate due to severity of illness  Dispo: The patient is from: Home              Anticipated d/c is to: Home              Patient currently is not medically stable to d/c.   Difficult to place patient No      TOTAL TIME TAKING CARE OF THIS PATIENT: 55 minutes.    Hannah Beat M.D on 04/08/2021 at 10:37 PM  [facility]   From 7 PM-7 AM, contact night-coverage [email or web address]  CC: Primary care physician; Lorenso Quarry, NP

## 2021-04-08 NOTE — ED Provider Notes (Signed)
[facility] Emergency Department Provider Note ____________________________________________   Event Date/Time   First MD Initiated Contact with Patient 04/08/21 1923     (approximate)  I have reviewed the triage vital signs and the nursing notes.  HISTORY  Chief Complaint Seizures   HPI Billy Wood is a 40 y.o. malewho presents to the ED for evaluation of witnessed seizure-like activity.  Chart review indicates medical admission last year in the [facility] for witnessed seizure activity in the setting of alcohol withdrawals.  MRI and EEG without epileptiform or intracranial pathology.  He left AMA after 3 days.  Patient returns today to the ED, accompanied by his girlfriend, for evaluation of seizure that occurred at home earlier today.  Patient reports that he was "retching and drinking, retching and drinking... And then I just passed out"  Girlfriend reports coming home from work finding him drinking shots of liquor, alternating with nonbloody nonbilious emesis, when he suddenly went rigid and had generalized tonic-clonic activity for about 30 seconds before falling asleep and being confused.  Ultimately returned to baseline as he is now.  No recurrent activity.  Patient reports drinking a 12 pack of beer per day as well as 6-12 shots of liquor per day.  Denies additional recreational drug use beyond his ethanol.  He cannot recall the last day he did not drink ethanol.  Denies recent fevers or syncopal episodes prior to this episode earlier today.  Reports frequently having diarrhea and emesis, but denies any significant changes to his medical status recently or recent illnesses beyond this.  History reviewed. No pertinent past medical history.  Patient Active Problem List   Diagnosis Date Noted   Alcohol withdrawal seizure (HCC) 04/08/2021    History reviewed. No pertinent surgical history.  Prior to Admission medications   Medication Sig  Start Date End Date Taking? Authorizing Provider  ibuprofen (ADVIL) 600 MG tablet Take 1 tablet (600 mg total) by mouth every 6 (six) hours as needed. Patient not taking: Reported on 04/08/2021 11/27/20   Becky Augusta, NP    Allergies Patient has no known allergies.  Family History  Problem Relation Age of Onset   Healthy Mother    Healthy Father     Social History Social History   Tobacco Use   Smoking status: Never   Smokeless tobacco: Never  Vaping Use   Vaping Use: Never used  Substance Use Topics   Alcohol use: Yes    Comment: occasional    Drug use: No    Review of Systems  Constitutional: No fever/chills Eyes: No visual changes. ENT: No sore throat. Cardiovascular: Denies chest pain. Respiratory: Denies shortness of breath. Gastrointestinal: Positive for chronic abdominal discomfort, nausea, emesis and diarrhea.   No constipation. Genitourinary: Negative for dysuria. Musculoskeletal: Negative for back pain. Skin: Negative for rash. Neurological: Negative for headaches, focal weakness or numbness.  Positive for weight and seizure-like activity. ____________________________________________   PHYSICAL EXAM:  VITAL SIGNS: Vitals:   04/08/21 2130 04/08/21 2230  BP: (!) 149/100 (!) 139/91  Pulse: 89 92  Resp: 14 16  Temp:    SpO2: 98% 98%    Constitutional: Alert and oriented.  Appears quite uncomfortable, laying on his left side.  Tremulous throughout. Eyes: Conjunctivae are normal. PERRL. EOMI. Head: Atraumatic. Nose: No congestion/rhinnorhea. Mouth/Throat: Mucous membranes are dry.  Oropharynx non-erythematous. Neck: No stridor. No cervical spine tenderness to palpation. Cardiovascular: Tachycardic rate, regular rhythm. Grossly normal heart sounds.  Good peripheral circulation. Respiratory:  Normal respiratory effort.  No retractions. Lungs CTAB. Gastrointestinal: Soft , nondistended,. No CVA tenderness. Mild epigastric and periumbilical tenderness  without peritoneal features. Musculoskeletal: No lower extremity tenderness nor edema.  No joint effusions. No signs of acute trauma. Neurologic:  Normal speech and language. No gross focal neurologic deficits are appreciated. Skin:  Skin is warm, dry and intact. No rash noted. Psychiatric: Mood and affect are appropriate. ____________________________________________   LABS (all labs ordered are listed, but only abnormal results are displayed)  Labs Reviewed  BASIC METABOLIC PANEL - Abnormal; Notable for the following components:      Result Value   Potassium 3.4 (*)    Chloride 96 (*)    Glucose, Bld 190 (*)    Anion gap 17 (*)    All other components within normal limits  CBC - Abnormal; Notable for the following components:   MCHC 36.7 (*)    Platelets 93 (*)    All other components within normal limits  HEPATIC FUNCTION PANEL - Abnormal; Notable for the following components:   AST 158 (*)    ALT 90 (*)    Total Bilirubin 1.9 (*)    Bilirubin, Direct 0.5 (*)    Indirect Bilirubin 1.4 (*)    All other components within normal limits  LIPASE, BLOOD - Abnormal; Notable for the following components:   Lipase 65 (*)    All other components within normal limits  RESP PANEL BY RT-PCR (FLU A&B, COVID) ARPGX2  ETHANOL  MAGNESIUM  HIV ANTIBODY (ROUTINE TESTING W REFLEX)  CBC  COMPREHENSIVE METABOLIC PANEL  CBG MONITORING, ED   ____________________________________________  12 Lead EKG  Sinus rhythm, rate of 87 bpm.  Normal axis and intervals.  No evidence of acute ischemia. ____________________________________________  RADIOLOGY  ED MD interpretation: CT head reviewed by me without evidence of acute intracranial pathology.  Official radiology report(s): CT HEAD WO CONTRAST ( )  Result Date: 04/08/2021 CLINICAL DATA:  Dizziness and head trauma EXAM: CT HEAD WITHOUT CONTRAST TECHNIQUE: Contiguous axial images were obtained from the base of the skull through the vertex  without intravenous contrast. COMPARISON:  None. FINDINGS: Brain: There is no mass, hemorrhage or extra-axial collection. The size and configuration of the ventricles and extra-axial CSF spaces are normal. The brain parenchyma is normal, without acute or chronic infarction. Vascular: No abnormal hyperdensity of the major intracranial arteries or dural venous sinuses. No intracranial atherosclerosis. Skull: The visualized skull base, calvarium and extracranial soft tissues are normal. Sinuses/Orbits: No fluid levels or advanced mucosal thickening of the visualized paranasal sinuses. No mastoid or middle ear effusion. The orbits are normal. IMPRESSION: Normal head CT. Electronically Signed   By: Deatra Robinson M.D.   On: 04/08/2021 21:07    ____________________________________________   PROCEDURES and INTERVENTIONS  Procedure(s) performed (including Critical Care):  .1-3 Lead EKG Interpretation  Date/Time: 04/08/2021 10:43 PM Performed by: Delton Prairie, MD Authorized by: Delton Prairie, MD     Interpretation: normal     ECG rate:  95   ECG rate assessment: normal     Rhythm: sinus rhythm     Ectopy: none     Conduction: normal    Medications  LORazepam (ATIVAN) injection 0-4 mg (0 mg Intravenous Hold 04/08/21 2113)    Or  LORazepam (ATIVAN) tablet 0-4 mg ( Oral See Alternative 04/08/21 2113)  LORazepam (ATIVAN) injection 0-4 mg (has no administration in time range)    Or  LORazepam (ATIVAN) tablet 0-4 mg (has no administration in time  range)  0.9 %  sodium chloride infusion (has no administration in time range)  acetaminophen (TYLENOL) tablet 650 mg (has no administration in time range)    Or  acetaminophen (TYLENOL) suppository 650 mg (has no administration in time range)  traZODone (DESYREL) tablet 25 mg (has no administration in time range)  magnesium hydroxide (MILK OF MAGNESIA) suspension 30 mL (has no administration in time range)  ondansetron (ZOFRAN) tablet 4 mg (has no  administration in time range)    Or  ondansetron (ZOFRAN) injection 4 mg (has no administration in time range)  lactated ringers bolus 1,000 mL (0 mLs Intravenous Stopped 04/08/21 2140)  chlordiazePOXIDE (LIBRIUM) capsule 25 mg (25 mg Oral Given 04/08/21 2032)  chlordiazePOXIDE (LIBRIUM) capsule 25 mg (25 mg Oral Given 04/08/21 2104)  thiamine (B-1) injection 500 mg (500 mg Intramuscular Given 04/08/21 2104)  multivitamin with minerals tablet 1 tablet (1 tablet Oral Given 04/08/21 2104)    ____________________________________________   MDM / ED COURSE   40 year old male presents to the ED after a seizure at home, likely due to alcohol withdrawals, and requiring medical admission. Is neurologically intact and appears quite uncomfortable, withdrawing from alcohol.  Blood work with hepatic derangements consistent with his longstanding alcoholism.  No evidence of acute intracranial pathology to precipitate seizures.  He is currently lucid and oriented without evidence of DTs.  We will provide appropriate supportive medications, Librium, placed on CIWA protocol for medical admission.  Clinical Course as of 04/08/21 2237  Mon Apr 08, 2021  2148 reassessed [DS]    Clinical Course User Index [DS] Delton Prairie, MD    ____________________________________________   FINAL CLINICAL IMPRESSION(S) / ED DIAGNOSES  Final diagnoses:  Seizure (HCC)  Alcoholism [facility])     ED Discharge Orders     None        Madelein Mahadeo Katrinka Blazing   Note:  This document was prepared using Dragon voice recognition software and may include unintentional dictation errors.    Delton Prairie, MD 04/08/21 2245

## 2021-04-08 NOTE — ED Triage Notes (Signed)
Pt comes via EMS with c/o witnessed seizure. Pt not on meds. Pt did have seizure year ago. Pt also reports drinking beer and liquor today.. pt did fall and hit head and states headache and dizziness now.  BP-146/90 HR-125 95% RA

## 2021-04-09 ENCOUNTER — Other Ambulatory Visit: Payer: Self-pay | Admitting: Family Medicine

## 2021-04-09 ENCOUNTER — Encounter: Payer: Self-pay | Admitting: Internal Medicine

## 2021-04-09 DIAGNOSIS — F10239 Alcohol dependence with withdrawal, unspecified: Principal | ICD-10-CM

## 2021-04-09 DIAGNOSIS — F10939 Alcohol use, unspecified with withdrawal, unspecified: Secondary | ICD-10-CM | POA: Diagnosis present

## 2021-04-09 DIAGNOSIS — F1020 Alcohol dependence, uncomplicated: Secondary | ICD-10-CM | POA: Diagnosis present

## 2021-04-09 DIAGNOSIS — D696 Thrombocytopenia, unspecified: Secondary | ICD-10-CM

## 2021-04-09 LAB — CBC
HCT: 34.9 % — ABNORMAL LOW (ref 39.0–52.0)
Hemoglobin: 12.8 g/dL — ABNORMAL LOW (ref 13.0–17.0)
MCH: 34 pg (ref 26.0–34.0)
MCHC: 36.7 g/dL — ABNORMAL HIGH (ref 30.0–36.0)
MCV: 92.8 fL (ref 80.0–100.0)
Platelets: 82 10*3/uL — ABNORMAL LOW (ref 150–400)
RBC: 3.76 MIL/uL — ABNORMAL LOW (ref 4.22–5.81)
RDW: 11.9 % (ref 11.5–15.5)
WBC: 5.7 10*3/uL (ref 4.0–10.5)
nRBC: 0 % (ref 0.0–0.2)

## 2021-04-09 LAB — COMPREHENSIVE METABOLIC PANEL
ALT: 67 U/L — ABNORMAL HIGH (ref 0–44)
AST: 106 U/L — ABNORMAL HIGH (ref 15–41)
Albumin: 3.8 g/dL (ref 3.5–5.0)
Alkaline Phosphatase: 56 U/L (ref 38–126)
Anion gap: 8 (ref 5–15)
BUN: 7 mg/dL (ref 6–20)
CO2: 27 mmol/L (ref 22–32)
Calcium: 8.8 mg/dL — ABNORMAL LOW (ref 8.9–10.3)
Chloride: 99 mmol/L (ref 98–111)
Creatinine, Ser: 0.67 mg/dL (ref 0.61–1.24)
GFR, Estimated: >60 mL/min (ref >60)
Glucose, Bld: 86 mg/dL (ref 70–99)
Potassium: 3.4 mmol/L — ABNORMAL LOW (ref 3.5–5.1)
Sodium: 134 mmol/L — ABNORMAL LOW (ref 135–145)
Total Bilirubin: 1.7 mg/dL — ABNORMAL HIGH (ref 0.3–1.2)
Total Protein: 6.5 g/dL (ref 6.5–8.1)

## 2021-04-09 LAB — HIV ANTIBODY (ROUTINE TESTING W REFLEX): HIV Screen 4th Generation wRfx: NONREACTIVE

## 2021-04-09 LAB — LIPASE, BLOOD: Lipase: 55 U/L — ABNORMAL HIGH (ref 11–51)

## 2021-04-09 MED ORDER — CHLORDIAZEPOXIDE HCL 5 MG PO CAPS
10.0000 mg | ORAL_CAPSULE | Freq: Three times a day (TID) | ORAL | Status: DC
  Administered 2021-04-09: 10 mg via ORAL
  Filled 2021-04-09: qty 2

## 2021-04-09 MED ORDER — POTASSIUM CHLORIDE CRYS ER 20 MEQ PO TBCR
20.0000 meq | EXTENDED_RELEASE_TABLET | Freq: Once | ORAL | Status: AC
  Administered 2021-04-09: 18:00:00 20 meq via ORAL
  Filled 2021-04-09: qty 1

## 2021-04-09 MED ORDER — ADULT MULTIVITAMIN W/MINERALS CH
1.0000 | ORAL_TABLET | Freq: Every day | ORAL | Status: DC
  Administered 2021-04-10 – 2021-04-11 (×2): 1 via ORAL
  Filled 2021-04-09 (×2): qty 1

## 2021-04-09 MED ORDER — THIAMINE HCL 100 MG PO TABS
100.0000 mg | ORAL_TABLET | Freq: Every day | ORAL | Status: DC
  Administered 2021-04-10 – 2021-04-11 (×2): 100 mg via ORAL
  Filled 2021-04-09 (×2): qty 1

## 2021-04-09 NOTE — Progress Notes (Signed)
Eeg done 

## 2021-04-09 NOTE — Procedures (Signed)
Patient going to 117-went to ED but transport otw

## 2021-04-09 NOTE — Progress Notes (Signed)
Pt made aware that he is on seizure precautions and fall precautions d/t seizure yesterday and recent falls at home. Pt bed alarm on and pt asked to call before getting up. Pt has refused these measures even though he has been educated on what could happen by not complying. Pt walking around room at this time.

## 2021-04-09 NOTE — ED Notes (Signed)
Per Dr. Allena Katz, pt is appropriate for med/surg. Dr. Allena Katz states that pts HR has decreased now & CIWA scores have been appropriate for a regular med/surg floor. Dr. Allena Katz states she has already changed this order in the eMAR & that she is going to see the pt.

## 2021-04-09 NOTE — Progress Notes (Signed)
[facility]  - [place] [facility]   PATIENT NAME: Billy Wood    MR#:  [id number]  DATE OF BIRTH:  04/28/1981  SUBJECTIVE:   patient came in the ER with seizures at home witnessed by girlfriend. Appears alcohol-related. Has history of severe alcoholism. Drinks beer and liquor daily.  Patient awake alert. Tells me he wants to quit. Girlfriend at bedside. No seizures reported in the ER. Patient has been following at home with bruises and extremity REVIEW OF SYSTEMS:   Review of Systems  Constitutional:  Negative for chills, fever and weight loss.  HENT:  Negative for ear discharge, ear pain and nosebleeds.   Eyes:  Negative for blurred vision, pain and discharge.  Respiratory:  Negative for sputum production, shortness of breath, wheezing and stridor.   Cardiovascular:  Negative for chest pain, palpitations, orthopnea and PND.  Gastrointestinal:  Negative for abdominal pain, diarrhea, nausea and vomiting.  Genitourinary:  Negative for frequency and urgency.  Musculoskeletal:  Positive for falls. Negative for back pain and joint pain.  Neurological:  Positive for weakness. Negative for sensory change, speech change and focal weakness.  Psychiatric/Behavioral:  Negative for depression and hallucinations. The patient is not nervous/anxious.   Tolerating Diet:yes Tolerating PT: pending  DRUG ALLERGIES:  No Known Allergies  VITALS:  Blood pressure 125/81, pulse 75, temperature 98.9 F (37.2 C), resp. rate 18, height 5\' 11"  (1.803 m), weight 63.5 kg, SpO2 100 %.  PHYSICAL EXAMINATION:   Physical Exam  GENERAL:  40 y.o.-year-old patient lying in the bed with no acute distress.  Disheveled+ LUNGS: Normal breath sounds bilaterally, no wheezing, rales, rhonchi. No use of accessory muscles of respiration.  CARDIOVASCULAR: S1, S2 normal. No murmurs, rubs, or gallops.  ABDOMEN: Soft, nontender, nondistended. Bowel sounds present. No organomegaly or mass.  EXTREMITIES:  No cyanosis, clubbing or edema b/l.    NEUROLOGIC: non focal, weak, deconditioned, tremors + UE mild PSYCHIATRIC:  patient is alert and awake.  SKIN: bruises+  LABORATORY PANEL:  CBC Recent Labs  Lab 04/09/21 0501  WBC 5.7  HGB 12.8*  HCT 34.9*  PLT 82*    Chemistries  Recent Labs  Lab 04/08/21 1935 04/09/21 0501  NA 136 134*  K 3.4* 3.4*  CL 96* 99  CO2 23 27  GLUCOSE 190* 86  BUN 7 7  CREATININE 0.77 0.67  CALCIUM 9.8 8.8*  MG 1.7  --   AST 158* 106*  ALT 90* 67*  ALKPHOS 71 56  BILITOT 1.9* 1.7*   Cardiac Enzymes No results for input(s): TROPONINI in the last 168 hours. RADIOLOGY:  CT HEAD WO CONTRAST (04/11/21)  Result Date: 04/08/2021 CLINICAL DATA:  Dizziness and head trauma EXAM: CT HEAD WITHOUT CONTRAST TECHNIQUE: Contiguous axial images were obtained from the base of the skull through the vertex without intravenous contrast. COMPARISON:  None. FINDINGS: Brain: There is no mass, hemorrhage or extra-axial collection. The size and configuration of the ventricles and extra-axial CSF spaces are normal. The brain parenchyma is normal, without acute or chronic infarction. Vascular: No abnormal hyperdensity of the major intracranial arteries or dural venous sinuses. No intracranial atherosclerosis. Skull: The visualized skull base, calvarium and extracranial soft tissues are normal. Sinuses/Orbits: No fluid levels or advanced mucosal thickening of the visualized paranasal sinuses. No mastoid or middle ear effusion. The orbits are normal. IMPRESSION: Normal head CT. Electronically Signed   By: 04/10/2021 M.D.   On: 04/08/2021 21:07   EEG adult  Result Date: 04/09/2021  [name], MD     04/09/2021  4:06 PM History: 40 yo M with suspected ETOH withdrawal seizure Sedation: None Technique: This EEG was acquired with electrodes placed according to the International 10-20 electrode system (including Fp1, Fp2, F3, F4, C3, C4, P3, P4, O1, O2, T3, T4, T5, T6, A1, A2, Fz,  Cz, Pz). The following electrodes were missing or displaced: none. Background: There is significant muscle artifact throughout the recording. The background consists of intermixed alpha and beta activities. There is a well defined posterior dominant rhythm of 9 - 10 Hz that attenuates with eye opening. Sleep is not recorded. Photic stimulation: Physiologic driving is present EEG Abnormalities: None Clinical Interpretation: This normal EEG is recorded in the waking state. There was no seizure or seizure predisposition recorded on this study. Please note that lack of epileptiform activity on EEG does not preclude the possibility of epilepsy. Ritta Slot, MD [facility] [phone] If 7pm- 7am, please page neurology on call as listed in AMION.   EEG adult  Result Date: 04/09/2021 [name]     04/09/2021  1:22 PM Patient going to 117-went to ED but transport otw  ASSESSMENT AND PLAN:  Tore Carreker is a 40 y.o. Caucasian male with medical history significant for ongoing alcohol abuse and dependence as well as alcohol withdrawal seizure once about a year ago, who presented to the emergency room with acute onset of seizure that was witnessed by his girlfriend.  She described it as shaking all over without tongue bites.  Seizures likely alcohol withdrawal -- patient has history of alcohol withdrawal seizures and has been admitted last summer at [facility] -- no seizures so far. -- EEG shows normal in waking state -- continue alcohol withdrawal protocol with IV Ativan -- seizure precaution  alcohol dependence and withdrawal -- continue alcohol withdrawal protocol with Ativan -- PO Librium 10 mg TID -- psych consult place for severe alcohol dependence-- there is a possibility of underlying depression  Hypokalemia -- oral potassium  elevated LFTs secondary to alcoholic hepatitis mild elevated lipase could be due to pancreatitis from alcohol -- labs showing some  improvement -- patient denies any abdominal pain. Tolerating regular diet  falls with bruises -- PT to see patient  thrombocytopenia suspect alcohol-related -- continue to monitor     Procedures: Family communication : mother on the phone. Girlfriend at bedside Consults :Psychiatry CODE STATUS: full DVT Prophylaxis :SCD (low plt count) Level of care: Med-Surg Status is: Inpatient  Remains inpatient appropriate because:Inpatient level of care appropriate due to severity of illness  Dispo: The patient is from: Home              Anticipated d/c is to: Home              Patient currently is not medically stable to d/c.   Difficult to place patient No        TOTAL TIME TAKING CARE OF THIS PATIENT: 25 minutes.  >50% time spent on counselling and coordination of care  Note: This dictation was prepared with Dragon dictation along with smaller phrase technology. Any transcriptional errors that result from this process are unintentional.  Enedina Finner M.D    [facility]   CC: Primary care physician; Lorenso Quarry, NP Patient ID: Frutoso Schatz, male   DOB: 1981/06/01, 40 y.o.   MRN: [id number]

## 2021-04-09 NOTE — Procedures (Signed)
History: 40 yo M with suspected ETOH withdrawal seizure  Sedation: None  Technique: This EEG was acquired with electrodes placed according to the International 10-20 electrode system (including Fp1, Fp2, F3, F4, C3, C4, P3, P4, O1, O2, T3, T4, T5, T6, A1, A2, Fz, Cz, Pz). The following electrodes were missing or displaced: none.   Background: There is significant muscle artifact throughout the recording. The background consists of intermixed alpha and beta activities. There is a well defined posterior dominant rhythm of 9 - 10 Hz that attenuates with eye opening. Sleep is not recorded.  Photic stimulation: Physiologic driving is present  EEG Abnormalities: None  Clinical Interpretation: This normal EEG is recorded in the waking state. There was no seizure or seizure predisposition recorded on this study. Please note that lack of epileptiform activity on EEG does not preclude the possibility of epilepsy.   Ritta Slot, MD [facility] [phone]  If 7pm- 7am, please page neurology on call as listed in AMION.

## 2021-04-10 LAB — LIPASE, BLOOD: Lipase: 81 U/L — ABNORMAL HIGH (ref 11–51)

## 2021-04-10 MED ORDER — NALTREXONE HCL 50 MG PO TABS
25.0000 mg | ORAL_TABLET | Freq: Once | ORAL | Status: AC
  Administered 2021-04-10: 25 mg via ORAL
  Filled 2021-04-10: qty 1

## 2021-04-10 MED ORDER — GABAPENTIN 400 MG PO CAPS
400.0000 mg | ORAL_CAPSULE | Freq: Three times a day (TID) | ORAL | Status: DC
  Administered 2021-04-10 – 2021-04-11 (×4): 400 mg via ORAL
  Filled 2021-04-10: qty 1
  Filled 2021-04-10 (×2): qty 4
  Filled 2021-04-10: qty 1
  Filled 2021-04-10: qty 4
  Filled 2021-04-10 (×2): qty 1
  Filled 2021-04-10: qty 4
  Filled 2021-04-10 (×2): qty 1

## 2021-04-10 MED ORDER — NALTREXONE HCL 50 MG PO TABS
50.0000 mg | ORAL_TABLET | Freq: Every day | ORAL | Status: DC
  Administered 2021-04-11: 50 mg via ORAL
  Filled 2021-04-10: qty 1

## 2021-04-10 NOTE — Consult Note (Signed)
[facility] Face-to-Face Psychiatry Consult   Reason for Consult:  Alcohol intoxication Referring Physician:  Dr Allena Katz Patient Identification: Billy Wood MRN:  [id number] Principal Diagnosis: Alcohol use disorder, severe, dependence (HCC) Diagnosis:  Principal Problem:   Alcohol use disorder, severe, dependence (HCC) Active Problems:   Alcohol withdrawal seizure (HCC)   Alcohol withdrawal (HCC)   Thrombocytopenia (HCC)   Total Time spent with patient: 1 hour  Subjective:   Billy Wood is a 40 y.o. male patient admitted with alcohol detox with withdrawal symptoms.  HPI:  40 yo male who reports drinking "a lot" for 15 years.  Lately he has been drinking a 12 pack of beer and a 1/5 of liquor.  He started drinking at the age of 60 when he feels it also became a problem.  No detoxs or rehabs.  Denies any DUIs or other legal issues related to his drinking.  No other substance use including nicotine.  Maternal side of his family with alcohol use disorders, "It's a family thing." Denies depression, anxiety, or other psychiatric issues.  Recommended detox and rehab, client declined.  He is willing to do virtual IOP and virtual AA as he wants to return to work.  Discussed the severity of his drinking and seizure to no avail.  He is willing to start naltrexone to assist with cravings, will start 25 mg and increase to 50 if tolerated.    Past Psychiatric History: alcohol use d/o  Risk to Self:  none Risk to Others:  none Prior Inpatient Therapy:  none Prior Outpatient Therapy:  none  Past Medical History:  Past Medical History:  Diagnosis Date   Substance abuse (HCC)    History reviewed. No pertinent surgical history. Family History:  Family History  Problem Relation Age of Onset   Healthy Mother    Healthy Father    Family Psychiatric  History: maternal side with alcohol use d/o Social History:  Social History   Substance and Sexual Activity  Alcohol Use Yes   Alcohol/week: 126.0 - 168.0  standard drinks   Types: 84 Cans of beer, 42 - 84 Shots of liquor per week   Comment: occasional      Social History   Substance and Sexual Activity  Drug Use No    Social History   Socioeconomic History   Marital status: Single    Spouse name: Not on file   Number of children: Not on file   Years of education: Not on file   Highest education level: Not on file  Occupational History   Not on file  Tobacco Use   Smoking status: Never   Smokeless tobacco: Never  Vaping Use   Vaping Use: Never used  Substance and Sexual Activity   Alcohol use: Yes    Alcohol/week: 126.0 - 168.0 standard drinks    Types: 84 Cans of beer, 42 - 84 Shots of liquor per week    Comment: occasional    Drug use: No   Sexual activity: Yes  Other Topics Concern   Not on file  Social History Narrative   Not on file   Social Determinants of Health   Financial Resource Strain: Not on file  Food Insecurity: Not on file  Transportation Needs: Not on file  Physical Activity: Not on file  Stress: Not on file  Social Connections: Not on file   Additional Social History:    Allergies:  No Known Allergies  Labs:  Results for orders placed or performed during the hospital  encounter of 04/08/21 (from the past 48 hour(s))  Basic metabolic panel - if new onset seizures     Status: Abnormal   Collection Time: 04/08/21  7:35 PM  Result Value Ref Range   Sodium 136 135 - 145 mmol/L   Potassium 3.4 (L) 3.5 - 5.1 mmol/L   Chloride 96 (L) 98 - 111 mmol/L   CO2 23 22 - 32 mmol/L   Glucose, Bld 190 (H) 70 - 99 mg/dL    Comment: Glucose reference range applies only to samples taken after fasting for at least 8 hours.   BUN 7 6 - 20 mg/dL   Creatinine, Ser 1.610.77 0.61 - 1.24 mg/dL   Calcium 9.8 8.9 - 09.610.3 mg/dL   GFR, Estimated >04>60 >54>60 mL/min    Comment: (NOTE) Calculated using the CKD-EPI Creatinine Equation (2021)    Anion gap 17 (H) 5 - 15    Comment: Performed at [facility], [address] [place], [place] [address]  CBC - if new onset seizures     Status: Abnormal   Collection Time: 04/08/21  7:35 PM  Result Value Ref Range   WBC 6.7 4.0 - 10.5 K/uL   RBC 4.41 4.22 - 5.81 MIL/uL   Hemoglobin 14.9 13.0 - 17.0 g/dL   HCT 91.440.6 78.239.0 - 95.652.0 %   MCV 92.1 80.0 - 100.0 fL   MCH 33.8 26.0 - 34.0 pg   MCHC 36.7 (H) 30.0 - 36.0 g/dL   RDW 21.312.0 08.611.5 - 57.815.5 %   Platelets 93 (L) 150 - 400 K/uL    Comment: Immature Platelet Fraction may be clinically indicated, consider ordering this additional test [id number]    nRBC 0.0 0.0 - 0.2 %    Comment: Performed at [facility], [address] [place], [place] [address]  Ethanol     Status: None   Collection Time: 04/08/21  7:35 PM  Result Value Ref Range   Alcohol, Ethyl (B) <10 <10 mg/dL    Comment: (NOTE) Lowest detectable limit for serum alcohol is 10 mg/dL.  For medical purposes only. Performed at [facility], [address] [place], [place] [address]   Magnesium     Status: None   Collection Time: 04/08/21  7:35 PM  Result Value Ref Range   Magnesium 1.7 1.7 - 2.4 mg/dL    Comment: Performed at [facility], [address] [place], [place] [address]  Hepatic function panel     Status: Abnormal   Collection Time: 04/08/21  7:35 PM  Result Value Ref Range   Total Protein 7.8 6.5 - 8.1 g/dL   Albumin 4.6 3.5 - 5.0 g/dL   AST 664158 (H) 15 - 41 U/L   ALT 90 (H) 0 - 44 U/L   Alkaline Phosphatase 71 38 - 126 U/L   Total Bilirubin 1.9 (H) 0.3 - 1.2 mg/dL   Bilirubin, Direct 0.5 (H) 0.0 - 0.2 mg/dL   Indirect Bilirubin 1.4 (H) 0.3 - 0.9 mg/dL    Comment: Performed at [facility], [address] [place], [place] [address]  Lipase, blood     Status: Abnormal   Collection Time: 04/08/21  7:35 PM  Result Value Ref Range   Lipase 65 (H) 11 - 51 U/L    Comment: Performed at [facility], [address] [place], [place] [address]  Resp Panel by RT-PCR (Flu A&B, Covid)  Nasopharyngeal Swab     Status: None   Collection Time: 04/08/21  9:17 PM   Specimen: Nasopharyngeal Swab; Nasopharyngeal(NP) swabs in vial transport medium  Result Value Ref Range   SARS Coronavirus 2 by RT PCR NEGATIVE NEGATIVE    Comment: (NOTE) SARS-CoV-2 target nucleic acids are NOT DETECTED.  The SARS-CoV-2 RNA is generally detectable in upper respiratory specimens during the acute phase of infection. The lowest concentration of SARS-CoV-2 viral copies this assay can detect is 138 copies/mL. A negative result does not preclude SARS-Cov-2 infection and should not be used as the sole basis for treatment or other patient management decisions. A negative result may occur with  improper specimen collection/handling, submission of specimen other than nasopharyngeal swab, presence of viral mutation(s) within the areas targeted by this assay, and inadequate number of viral copies(<138 copies/mL). A negative result must be combined with clinical observations, patient history, and epidemiological information. The expected result is Negative.  Fact Sheet for Patients:  BloggerCourse.com  Fact Sheet for Healthcare Providers:  SeriousBroker.it  This test is no t yet approved or cleared by the [place] FDA and  has been authorized for detection and/or diagnosis of SARS-CoV-2 by FDA under an Emergency Use Authorization (EUA). This EUA will remain  in effect (meaning this test can be used) for the duration of the COVID-19 declaration under Section 564(b)(1) of the Act, 21 U.S.C.section 360bbb-3(b)(1), unless the authorization is terminated  or revoked sooner.       Influenza A by PCR NEGATIVE NEGATIVE   Influenza B by PCR NEGATIVE NEGATIVE    Comment: (NOTE) The Xpert Xpress SARS-CoV-2/FLU/RSV plus assay is intended as an aid in the diagnosis of influenza from Nasopharyngeal swab specimens and should not be used as a sole basis for  treatment. Nasal washings and aspirates are unacceptable for Xpert Xpress SARS-CoV-2/FLU/RSV testing.  Fact Sheet for Patients: BloggerCourse.com  Fact Sheet for Healthcare Providers: SeriousBroker.it  This test is not yet approved or cleared by the [place] FDA and has been authorized for detection and/or diagnosis of SARS-CoV-2 by FDA under an Emergency Use Authorization (EUA). This EUA will remain in effect (meaning this test can be used) for the duration of the COVID-19 declaration under Section 564(b)(1) of the Act, 21 U.S.C. section 360bbb-3(b)(1), unless the authorization is terminated or revoked.  Performed at [facility], [address] [place], [place] [address]   CBC     Status: Abnormal   Collection Time: 04/09/21  5:01 AM  Result Value Ref Range   WBC 5.7 4.0 - 10.5 K/uL   RBC 3.76 (L) 4.22 - 5.81 MIL/uL   Hemoglobin 12.8 (L) 13.0 - 17.0 g/dL   HCT 40.9 (L) 81.1 - 91.4 %   MCV 92.8 80.0 - 100.0 fL   MCH 34.0 26.0 - 34.0 pg   MCHC 36.7 (H) 30.0 - 36.0 g/dL   RDW 78.2 95.6 - 21.3 %   Platelets 82 (L) 150 - 400 K/uL    Comment: Immature Platelet Fraction may be clinically indicated, consider ordering this additional test [id number]    nRBC 0.0 0.0 - 0.2 %    Comment: Performed at [facility], [address] [place], [place] [address]  Comprehensive metabolic panel     Status: Abnormal   Collection Time: 04/09/21  5:01 AM  Result Value Ref Range   Sodium 134 (L) 135 - 145 mmol/L   Potassium 3.4 (L) 3.5 - 5.1 mmol/L   Chloride 99 98 - 111 mmol/L   CO2 27 22 - 32 mmol/L   Glucose, Bld  86 70 - 99 mg/dL    Comment: Glucose reference range applies only to samples taken after fasting for at least 8 hours.   BUN 7 6 - 20 mg/dL   Creatinine, Ser 6.04 0.61 - 1.24 mg/dL   Calcium 8.8 (L) 8.9 - 10.3 mg/dL   Total Protein 6.5 6.5 - 8.1 g/dL   Albumin 3.8 3.5 - 5.0 g/dL   AST 540 (H) 15 - 41  U/L   ALT 67 (H) 0 - 44 U/L   Alkaline Phosphatase 56 38 - 126 U/L   Total Bilirubin 1.7 (H) 0.3 - 1.2 mg/dL   GFR, Estimated >98 >11 mL/min    Comment: (NOTE) Calculated using the CKD-EPI Creatinine Equation (2021)    Anion gap 8 5 - 15    Comment: Performed at [facility], [address] [place], [place] [address]  Lipase, blood     Status: Abnormal   Collection Time: 04/09/21  5:01 AM  Result Value Ref Range   Lipase 55 (H) 11 - 51 U/L    Comment: Performed at [facility], [address] [place], [place] [address]  HIV Antibody (routine testing w rflx)     Status: None   Collection Time: 04/09/21  5:01 AM  Result Value Ref Range   HIV Screen 4th Generation wRfx Non Reactive Non Reactive    Comment: Performed at [facility], [address] [place], [place] [address]  Lipase, blood     Status: Abnormal   Collection Time: 04/10/21  5:08 AM  Result Value Ref Range   Lipase 81 (H) 11 - 51 U/L    Comment: Performed at [facility], [address] [place], [place] [address]    Current Facility-Administered Medications  Medication Dose Route Frequency Provider Last Rate Last Admin   acetaminophen (TYLENOL) tablet 650 mg  650 mg Oral Q6H PRN Mansy, Jan A, MD   650 mg at 04/09/21 0009   Or   acetaminophen (TYLENOL) suppository 650 mg  650 mg Rectal Q6H PRN Mansy, Jan A, MD       gabapentin (NEURONTIN) capsule 400 mg  400 mg Oral TID Alberteen Sam, MD   400 mg at 04/10/21 0857   LORazepam (ATIVAN) injection 0-4 mg  0-4 mg Intravenous Q6H Delton Prairie, MD   1 mg at 04/10/21 0541   Or   LORazepam (ATIVAN) tablet 0-4 mg  0-4 mg Oral Q6H Delton Prairie, MD   1 mg at 04/10/21 1244   [START ON 04/11/2021] LORazepam (ATIVAN) injection 0-4 mg  0-4 mg Intravenous Q12H Delton Prairie, MD       Or   Melene Muller ON 04/11/2021] LORazepam (ATIVAN) tablet 0-4 mg  0-4 mg Oral Q12H Delton Prairie, MD       magnesium hydroxide (MILK OF MAGNESIA) suspension 30 mL  30  mL Oral Daily PRN Mansy, Jan A, MD       multivitamin with minerals tablet 1 tablet  1 tablet Oral Daily Enedina Finner, MD   1 tablet at 04/10/21 0857   ondansetron [facility]) tablet 4 mg  4 mg Oral Q6H PRN Mansy, Jan A, MD       Or   ondansetron [facility]) injection 4 mg  4 mg Intravenous Q6H PRN Mansy, Jan A, MD       thiamine tablet 100 mg  100 mg Oral Daily Enedina Finner, MD   100 mg at 04/10/21 0858   traZODone (DESYREL) tablet 25 mg  25 mg Oral QHS PRN Mansy, Jan A, MD   25 mg at 04/09/21 0012    Musculoskeletal: Strength & Muscle Tone: within normal limits Gait & Station: normal Patient leans: N/A  Psychiatric Specialty Exam: Physical Exam Vitals and nursing note reviewed.  Constitutional:      Appearance: Normal appearance.  HENT:     Head: Normocephalic.     Nose: Nose normal.  Pulmonary:     Effort: Pulmonary effort is normal.  Musculoskeletal:        General: Normal range of motion.     Cervical back: Normal range of motion.  Neurological:     General: No focal deficit present.     Mental Status: He is alert and oriented to person, place, and time.  Psychiatric:        Attention and Perception: Attention and perception normal.        Mood and Affect: Mood is anxious.        Speech: Speech normal.        Behavior: Behavior normal. Behavior is cooperative.        Thought Content: Thought content normal.        Cognition and Memory: Cognition and memory normal.        Judgment: Judgment normal.    Review of Systems  Blood pressure (!) 136/94, pulse 87, temperature 98 F (36.7 C), temperature source Oral, resp. rate 17, height 5\' 11"  (1.803 m), weight 63.5 kg, SpO2 100 %.Body mass index is 19.53 kg/m.  General Appearance: Casual  Eye Contact:  Good  Speech:  Normal Rate  Volume:  Normal  Mood:  Anxious  Affect:  Congruent  Thought Process:  Coherent and Descriptions of Associations: Intact  Orientation:  Full (Time, Place, and Person)  Thought Content:  WDL and  Logical  Suicidal Thoughts:  No  Homicidal Thoughts:  No  Memory:  Immediate;   Good Recent;   Good Remote;   Good  Judgement:  Fair  Insight:  Lacking  Psychomotor Activity:  Decreased  Concentration:  Concentration: Good and Attention Span: Good  Recall:  Good  Fund of Knowledge:  Good  Language:  Good  Akathisia:  No  Handed:  Right  AIMS (if indicated):     Assets:  Housing Intimacy Leisure Time Physical Health Resilience Social Support Vocational/Educational  ADL's:  Intact  Cognition:  WNL  Sleep:        Physical Exam: Physical Exam Vitals and nursing note reviewed.  Constitutional:      Appearance: Normal appearance.  HENT:     Head: Normocephalic.     Nose: Nose normal.  Pulmonary:     Effort: Pulmonary effort is normal.  Musculoskeletal:        General: Normal range of motion.     Cervical back: Normal range of motion.  Neurological:     General: No focal deficit present.     Mental Status: He is alert and oriented to person, place, and time.  Psychiatric:        Attention and Perception: Attention and perception normal.        Mood and Affect: Mood is anxious.        Speech: Speech normal.        Behavior: Behavior normal. Behavior is cooperative.        Thought Content: Thought content normal.        Cognition and Memory: Cognition and memory normal.        Judgment:  Judgment normal.   ROS Blood pressure (!) 136/94, pulse 87, temperature 98 F (36.7 C), temperature source Oral, resp. rate 17, height 5\' 11"  (1.803 m), weight 63.5 kg, SpO2 100 %. Body mass index is 19.53 kg/m.  Treatment Plan Summary: Alcohol use disorder: -Continue Ativan alcohol detox -Started naltrexone 25 mg daily with increase to 50 mg daily if tolerated to assist with cravings -Provided resources for IOP groups  Disposition: No evidence of imminent risk to self or others at present.   Patient does not meet criteria for psychiatric inpatient admission. Supportive therapy  provided about ongoing stressors.  , NP 04/10/2021 1:48 PM

## 2021-04-10 NOTE — Progress Notes (Signed)
Patient refused bed alarms.  Ask patient to call before getting up, her was educated on the risk of falls due  to non compliance

## 2021-04-10 NOTE — Progress Notes (Signed)
[facility] PROGRESS NOTE    [facility]  UQJ:[id number] DOB: Sep 10, 1980 DOA: 04/08/2021 PCP: Lorenso Quarry, NP      Brief Narrative:  Billy [facility] is a 40 y.o. M with alcohol use disorder and previous withdrawal without seizure or DTs who presents with alcohol withdrawal seizure.  Patient's last drink was Monday afternoon.  Seized Monday night.       Assessment & Plan:  Seizure due to alcohol withdrawal Alcohol use disorder with withdrawal CIWA is 4-9 overnight, no further seizures, no delirium.  Now ~48 hours from last drink. - Start gabapentin - Continue CIWA scoring with on demand Ativan -Continue thiamine and folate - TOC consult for withdrawal resources  Hypokalemia -Supplement potassium  Alcoholic hepatitis No steroids needed - Recommend cessation  Thrombocytopenia due to alcohol -Monitor        Disposition: Status is: Inpatient  Remains inpatient appropriate because:Inpatient level of care appropriate due to severity of illness  Dispo: The patient is from: Home              Anticipated d/c is to: Home              Patient currently is not medically stable to d/c.   Difficult to place patient No  Patient admitted with alcohol use disorder, withdrawal seizure.  He is now less than 48 hours out from his last drink, still very high risk to progress to DTs or another seizure.  Switch to gabapentin, continue CIWA monitoring.     Level of care: Med-Surg       MDM: The below labs and imaging reports were reviewed and summarized above.  Medication management as above.    DVT prophylaxis: SCDs Start: 04/08/21 2234  Code Status: FULL Family Communication: wife and mother at bedside          Subjective: No confusion, seizures.  He has tremor, feels irritable, but no headache, chest pain, abdominal pain.  Objective: Vitals:   04/09/21 2029 04/10/21 0510 04/10/21 0728 04/10/21 1324  BP: (!) 133/96 (!) 141/94 (!) 129/96  (!) 136/94  Pulse: 78 94 82 87  Resp: 18 16 17    Temp: 99.1 F (37.3 C) 98.2 F (36.8 C) 98.3 F (36.8 C) 98 F (36.7 C)  TempSrc: Oral Oral  Oral  SpO2: 100% 97% 100% 100%  Weight:      Height:        Intake/Output Summary (Last 24 hours) at 04/10/2021 1539 Last data filed at 04/10/2021 1403 Gross per 24 hour  Intake 120 ml  Output --  Net 120 ml   Filed Weights   04/08/21 1923  Weight: 63.5 kg    Examination: General appearance: thin adult male, alert and in no distress.   HEENT: Anicteric, conjunctiva pink, lids and lashes normal. No nasal deformity, discharge, epistaxis.  Lips moist.   Skin: Warm and dry.  no jaundice.  No suspicious rashes or lesions. Cardiac: RRR, nl S1-S2, no murmurs appreciated.  Capillary refill is brisk.  JVP normal.  no LE edema.  Radial pulses 2+ and symmetric. Respiratory: Normal respiratory rate and rhythm.  CTAB without rales or wheezes. Abdomen: Abdomen soft.  no TTP. No ascites, distension, hepatosplenomegaly.   MSK: No deformities or effusions. Neuro: Awake and alert.  EOMI, moves all extremities with generalized weak. Speech fluent.   Tremor noted Psych: Sensorium intact and responding to questions, attention normal. Affect normal.  Judgment and insight appear normal.    Data Reviewed: I have  personally reviewed following labs and imaging studies:  CBC: Recent Labs  Lab 04/08/21 1935 04/09/21 0501  WBC 6.7 5.7  HGB 14.9 12.8*  HCT 40.6 34.9*  MCV 92.1 92.8  PLT 93* 82*   Basic Metabolic Panel: Recent Labs  Lab 04/08/21 1935 04/09/21 0501  NA 136 134*  K 3.4* 3.4*  CL 96* 99  CO2 23 27  GLUCOSE 190* 86  BUN 7 7  CREATININE 0.77 0.67  CALCIUM 9.8 8.8*  MG 1.7  --    GFR: Estimated Creatinine Clearance: 109.1 mL/min (by C-G formula based on SCr of 0.67 mg/dL). Liver Function Tests: Recent Labs  Lab 04/08/21 1935 04/09/21 0501  AST 158* 106*  ALT 90* 67*  ALKPHOS 71 56  BILITOT 1.9* 1.7*  PROT 7.8 6.5   ALBUMIN 4.6 3.8   Recent Labs  Lab 04/08/21 1935 04/09/21 0501 04/10/21 0508  LIPASE 65* 55* 81*   No results for input(s): AMMONIA in the last 168 hours. Coagulation Profile: No results for input(s): INR, PROTIME in the last 168 hours. Cardiac Enzymes: No results for input(s): CKTOTAL, CKMB, CKMBINDEX, TROPONINI in the last 168 hours. BNP (last 3 results) No results for input(s): PROBNP in the last 8760 hours. HbA1C: No results for input(s): HGBA1C in the last 72 hours. CBG: No results for input(s): GLUCAP in the last 168 hours. Lipid Profile: No results for input(s): CHOL, HDL, LDLCALC, TRIG, CHOLHDL, LDLDIRECT in the last 72 hours. Thyroid Function Tests: No results for input(s): TSH, T4TOTAL, FREET4, T3FREE, THYROIDAB in the last 72 hours. Anemia Panel: No results for input(s): VITAMINB12, FOLATE, FERRITIN, TIBC, IRON, RETICCTPCT in the last 72 hours. Urine analysis: No results found for: COLORURINE, APPEARANCEUR, LABSPEC, PHURINE, GLUCOSEU, HGBUR, BILIRUBINUR, KETONESUR, PROTEINUR, UROBILINOGEN, NITRITE, LEUKOCYTESUR Sepsis Labs: @LABRCNTIP (procalcitonin:4,lacticacidven:4)  ) Recent Results (from the past 240 hour(s))  Resp Panel by RT-PCR (Flu A&B, Covid) Nasopharyngeal Swab     Status: None   Collection Time: 04/08/21  9:17 PM   Specimen: Nasopharyngeal Swab; Nasopharyngeal(NP) swabs in vial transport medium  Result Value Ref Range Status   SARS Coronavirus 2 by RT PCR NEGATIVE NEGATIVE Final    Comment: (NOTE) SARS-CoV-2 target nucleic acids are NOT DETECTED.  The SARS-CoV-2 RNA is generally detectable in upper respiratory specimens during the acute phase of infection. The lowest concentration of SARS-CoV-2 viral copies this assay can detect is 138 copies/mL. A negative result does not preclude SARS-Cov-2 infection and should not be used as the sole basis for treatment or other patient management decisions. A negative result may occur with  improper specimen  collection/handling, submission of specimen other than nasopharyngeal swab, presence of viral mutation(s) within the areas targeted by this assay, and inadequate number of viral copies(<138 copies/mL). A negative result must be combined with clinical observations, patient history, and epidemiological information. The expected result is Negative.  Fact Sheet for Patients:  04/10/21  Fact Sheet for Healthcare Providers:  BloggerCourse.com  This test is no t yet approved or cleared by the [place] FDA and  has been authorized for detection and/or diagnosis of SARS-CoV-2 by FDA under an Emergency Use Authorization (EUA). This EUA will remain  in effect (meaning this test can be used) for the duration of the COVID-19 declaration under Section 564(b)(1) of the Act, 21 U.S.C.section 360bbb-3(b)(1), unless the authorization is terminated  or revoked sooner.       Influenza A by PCR NEGATIVE NEGATIVE Final   Influenza B by PCR NEGATIVE NEGATIVE Final  Comment: (NOTE) The Xpert Xpress SARS-CoV-2/FLU/RSV plus assay is intended as an aid in the diagnosis of influenza from Nasopharyngeal swab specimens and should not be used as a sole basis for treatment. Nasal washings and aspirates are unacceptable for Xpert Xpress SARS-CoV-2/FLU/RSV testing.  Fact Sheet for Patients: BloggerCourse.comhttps://www.fda.gov/media/152166/download  Fact Sheet for Healthcare Providers: SeriousBroker.ithttps://www.fda.gov/media/152162/download  This test is not yet approved or cleared by the [place] FDA and has been authorized for detection and/or diagnosis of SARS-CoV-2 by FDA under an Emergency Use Authorization (EUA). This EUA will remain in effect (meaning this test can be used) for the duration of the COVID-19 declaration under Section 564(b)(1) of the Act, 21 U.S.C. section 360bbb-3(b)(1), unless the authorization is terminated or revoked.  Performed at [facility], [address] [place], [place] [id number]          Radiology Studies: CT HEAD WO CONTRAST (5MM)  Result Date: 04/08/2021 CLINICAL DATA:  Dizziness and head trauma EXAM: CT HEAD WITHOUT CONTRAST TECHNIQUE: Contiguous axial images were obtained from the base of the skull through the vertex without intravenous contrast. COMPARISON:  None. FINDINGS: Brain: There is no mass, hemorrhage or extra-axial collection. The size and configuration of the ventricles and extra-axial CSF spaces are normal. The brain parenchyma is normal, without acute or chronic infarction. Vascular: No abnormal hyperdensity of the major intracranial arteries or dural venous sinuses. No intracranial atherosclerosis. Skull: The visualized skull base, calvarium and extracranial soft tissues are normal. Sinuses/Orbits: No fluid levels or advanced mucosal thickening of the visualized paranasal sinuses. No mastoid or middle ear effusion. The orbits are normal. IMPRESSION: Normal head CT. Electronically Signed   By: Deatra RobinsonKevin  Herman M.D.   On: 04/08/2021 21:07   EEG adult  Result Date: 04/09/2021 Rejeana BrockKirkpatrick, McNeill P, MD     04/09/2021  4:06 PM History: 40 yo M with suspected ETOH withdrawal seizure Sedation: None Technique: This EEG was acquired with electrodes placed according to the International 10-20 electrode system (including Fp1, Fp2, F3, F4, C3, C4, P3, P4, O1, O2, T3, T4, T5, T6, A1, A2, Fz, Cz, Pz). The following electrodes were missing or displaced: none. Background: There is significant muscle artifact throughout the recording. The background consists of intermixed alpha and beta activities. There is a well defined posterior dominant rhythm of 9 - 10 Hz that attenuates with eye opening. Sleep is not recorded. Photic stimulation: Physiologic driving is present EEG Abnormalities: None Clinical Interpretation: This normal EEG is recorded in the waking state. There was no seizure or seizure predisposition recorded on  this study. Please note that lack of epileptiform activity on EEG does not preclude the possibility of epilepsy. Ritta SlotMcNeill Kirkpatrick, MD [facility] [phone] If 7pm- 7am, please page neurology on call as listed in AMION.   EEG adult  Result Date: 04/09/2021 Tresa Reslston, Sandra D     04/09/2021  1:22 PM Patient going to 117-went to ED but transport otw       Scheduled Meds:  gabapentin  400 mg Oral TID   LORazepam  0-4 mg Intravenous Q6H   Or   LORazepam  0-4 mg Oral Q6H   [START ON 04/11/2021] LORazepam  0-4 mg Intravenous Q12H   Or   [START ON 04/11/2021] LORazepam  0-4 mg Oral Q12H   multivitamin with minerals  1 tablet Oral Daily   thiamine  100 mg Oral Daily   Continuous Infusions:   LOS: 2 days    Time spent: 25 minutes    Jayro P  Maryfrances Bunnell, MD [facility] 04/10/2021, 3:39 PM     Please page though AMION or Epic secure chat:  For [employer], Higher education careers adviser

## 2021-04-11 LAB — LIPASE, BLOOD: Lipase: 72 U/L — ABNORMAL HIGH (ref 11–51)

## 2021-04-11 MED ORDER — THIAMINE HCL 100 MG PO TABS: 100.0000 mg | ORAL_TABLET | Freq: Every day | ORAL | 3 refills | Status: DC

## 2021-04-11 MED ORDER — NALTREXONE HCL 50 MG PO TABS: 50.0000 mg | ORAL_TABLET | Freq: Every day | ORAL | 3 refills | Status: DC

## 2021-04-11 MED ORDER — GABAPENTIN 400 MG PO CAPS
400.0000 mg | ORAL_CAPSULE | Freq: Three times a day (TID) | ORAL | 2 refills | Status: DC
Start: 2021-04-11 — End: 2022-10-28

## 2021-04-11 MED ORDER — FOLIC ACID 1 MG PO TABS: 1.0000 mg | ORAL_TABLET | Freq: Every day | ORAL | 3 refills | Status: AC

## 2021-04-11 NOTE — Progress Notes (Signed)
Patient refused bed alarms.  Patient didn't call before getting up, he was educated on the risk of falls due to non-compliance.

## 2021-04-11 NOTE — Plan of Care (Signed)

## 2021-04-11 NOTE — Discharge Summary (Signed)
Physician Discharge Summary  Billy Wood NWG:[id number] DOB: 03-Sep-1980 DOA: 04/08/2021  PCP: Lorenso Quarry, NP  Admit date: 04/08/2021 Discharge date: 04/11/2021  Admitted From:  Home Disposition:  Home  Recommendations for Outpatient Follow-up:  Follow up with Carollee Herter LEach PCP in 1-2 weeks Patient to follow up with outpatient SUT  Lorenso Quarry: Please obtain LFTs and CBC in 1 week and also 1-2 months Lorenso Quarry: Please monitor platelets and albumin and INR and if there is ongoing evidence of persistent synthetic liver dysfunction, refer for GI cirrhosis eval       Home Health: None  Equipment/Devices: None new  Discharge Condition: Poor  CODE STATUS: FULL Diet recommendation: Regular  Brief/Interim Summary: Billy Wood is a 40 y.o. M with alcohol use disorder and previous withdrawal without seizure or DTs who presents with alcohol withdrawal seizure.   Patient's last drink was Monday afternoon.  Seized Monday night.     PRINCIPAL HOSPITAL DIAGNOSIS: Alcohol related seizure    Discharge Diagnoses:   Seizure due to alcohol withdrawal Alcohol use disorder with withdrawal Patient admitted and started on CIWA scoring, on-demand lorazepam and gabapentin.    Psychiatry evaluated the patient and started naltrexone, provided information for outpatient substance use treatment and virtual AA and virtual IOP.    CIWA scores in the last 24 hours <5, withdrawal symptoms mild, no delirium, no confusion, no seizures.    Continue gabapentin 2 months then taper off.  Continue naltrexone indefinitely or as recommended by Substance Use specialist.    Continue thiamine and folate.        Ataxia Patient appears to have some mild ataxia, which he says has been present for about a month.  He also appears to have some cognitive impairment.  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  Discharge on thiamine and folate.      Hypokalemia Repleted.  Alcoholic hepatitis Recommend trend with PCP.  Thrombocytopenia due to alcohol Hopefully this is acute bone marrow stunning due to alcohol, and not cirrhosis.  Recommend PCP check synthetic liver function in 1-2 months of abstinence to verify resolution, if not improving, refer to GI for Cirrhosis eval.              Discharge Instructions  Discharge Instructions     Discharge instructions   Complete by: As directed    From Dr. Maryfrances Bunnell: You were admitted for a seizure from alcohol use. Alcohol use lowers the brain's "Seizure threshold" making it easier to have seizures.  They can happen spontaneously then, or if triggered by alcohol withdrawal.  You have some findings (the lack of balance) that imply some brain damage from alcohol. Take thiamine and folate to help with this.  You also have to take the gabapentin to reduce withdrawal symptoms and prevent seizures. Take gabapentin 400 mg three times daily for the next 2 1/2 months In mid November, taper down  (First reduce to gabapentin twice per day for 5 days then once per day for 5 days then stop)  Also, for cravings, take naltrexone, which reduces alcohol cravings Take naltrexone 50 mg daily Go see Dr. Etheleen Mayhew in 1-2 weeks and ask her to follow your liver tests in 1 week and then again in 1-2 months You can take naltrexone long term, or you can reduce off of it, if alcohol use treatment (the virtual IOP) is working well  Stop alcohol completely.   Increase activity slowly   Complete by: As directed  Allergies as of 04/11/2021   No Known Allergies      Medication List     STOP taking these medications    ibuprofen 600 MG tablet Commonly known as: ADVIL       TAKE these medications    folic acid 1 MG tablet Commonly known as: FOLVITE Take 1 tablet (1 mg total) by mouth daily.   gabapentin 400 MG capsule Commonly known as: NEURONTIN Take 1 capsule (400 mg total) by  mouth 3 (three) times daily.   naltrexone 50 MG tablet Commonly known as: DEPADE Take 1 tablet (50 mg total) by mouth daily. Start taking on: April 12, 2021   thiamine 100 MG tablet Take 1 tablet (100 mg total) by mouth daily. Start taking on: April 12, 2021        Follow-up Information     Lorenso Quarry, NP. Schedule an appointment as soon as possible for a visit in 1 week(s).   Specialty: Family Medicine Contact information: [address] [place] [address] [phone]                No Known Allergies  Consultations: Psychiatry   Procedures/Studies: CT HEAD WO CONTRAST ( )  Result Date: 04/08/2021 CLINICAL DATA:  Dizziness and head trauma EXAM: CT HEAD WITHOUT CONTRAST TECHNIQUE: Contiguous axial images were obtained from the base of the skull through the vertex without intravenous contrast. COMPARISON:  None. FINDINGS: Brain: There is no mass, hemorrhage or extra-axial collection. The size and configuration of the ventricles and extra-axial CSF spaces are normal. The brain parenchyma is normal, without acute or chronic infarction. Vascular: No abnormal hyperdensity of the major intracranial arteries or dural venous sinuses. No intracranial atherosclerosis. Skull: The visualized skull base, calvarium and extracranial soft tissues are normal. Sinuses/Orbits: No fluid levels or advanced mucosal thickening of the visualized paranasal sinuses. No mastoid or middle ear effusion. The orbits are normal. IMPRESSION: Normal head CT. Electronically Signed   By: Deatra Robinson M.D.   On: 04/08/2021 21:07   EEG adult  Result Date: 04/09/2021 [name], MD     04/09/2021  4:06 PM History: 40 yo M with suspected ETOH withdrawal seizure Sedation: None Technique: This EEG was acquired with electrodes placed according to the International 10-20 electrode system (including Fp1, Fp2, F3, F4, C3, C4, P3, P4, O1, O2, T3, T4, T5, T6, A1, A2, Fz, Cz, Pz). The  following electrodes were missing or displaced: none. Background: There is significant muscle artifact throughout the recording. The background consists of intermixed alpha and beta activities. There is a well defined posterior dominant rhythm of 9 - 10 Hz that attenuates with eye opening. Sleep is not recorded. Photic stimulation: Physiologic driving is present EEG Abnormalities: None Clinical Interpretation: This normal EEG is recorded in the waking state. There was no seizure or seizure predisposition recorded on this study. Please note that lack of epileptiform activity on EEG does not preclude the possibility of epilepsy. Ritta Slot, MD [facility] [phone] If 7pm- 7am, please page neurology on call as listed in AMION.   EEG adult  Result Date: 04/09/2021 [name]     04/09/2021  1:22 PM Patient going to 117-went to ED but transport otw     Subjective: Feeling well.  Some ataxia.  No seizures, confusion.  Discharge Exam: Vitals:   04/11/21 0411 04/11/21 0837  BP: (!) 123/91 118/78  Pulse: (!) 101 80  Resp: 16 18  Temp: (!) 97.5 F (36.4 C) 98  F (36.7 C)  SpO2: 100% 100%   Vitals:   04/10/21 1842 04/10/21 2106 04/11/21 0411 04/11/21 0837  BP: 129/72 106/64 (!) 123/91 118/78  Pulse: 73 (!) 56 (!) 101 80  Resp:  16 16 18   Temp:  98.6 F (37 C) (!) 97.5 F (36.4 C) 98 F (36.7 C)  TempSrc:  Oral Oral Oral  SpO2:  97% 100% 100%  Weight:      Height:        General: Pt is alert, awake, not in acute distress Cardiovascular: RRR, nl S1-S2, no murmurs appreciated.   No LE edema.   Respiratory: Normal respiratory rate and rhythm.  CTAB without rales or wheezes. Abdominal: Abdomen soft and non-tender.  No distension or HSM.   Neuro/Psych: Strength symmetric in upper and lower extremities.  Judgment and insight appear slightly impaired. Mild tremor.   The results of significant diagnostics from this hospitalization (including imaging,  microbiology, ancillary and laboratory) are listed below for reference.     Microbiology: Recent Results (from the past 240 hour(s))  Resp Panel by RT-PCR (Flu A&B, Covid) Nasopharyngeal Swab     Status: None   Collection Time: 04/08/21  9:17 PM   Specimen: Nasopharyngeal Swab; Nasopharyngeal(NP) swabs in vial transport medium  Result Value Ref Range Status   SARS Coronavirus 2 by RT PCR NEGATIVE NEGATIVE Final    Comment: (NOTE) SARS-CoV-2 target nucleic acids are NOT DETECTED.  The SARS-CoV-2 RNA is generally detectable in upper respiratory specimens during the acute phase of infection. The lowest concentration of SARS-CoV-2 viral copies this assay can detect is 138 copies/mL. A negative result does not preclude SARS-Cov-2 infection and should not be used as the sole basis for treatment or other patient management decisions. A negative result may occur with  improper specimen collection/handling, submission of specimen other than nasopharyngeal swab, presence of viral mutation(s) within the areas targeted by this assay, and inadequate number of viral copies(<138 copies/mL). A negative result must be combined with clinical observations, patient history, and epidemiological information. The expected result is Negative.  Fact Sheet for Patients:  04/10/21  Fact Sheet for Healthcare Providers:  BloggerCourse.com  This test is no t yet approved or cleared by the [place] FDA and  has been authorized for detection and/or diagnosis of SARS-CoV-2 by FDA under an Emergency Use Authorization (EUA). This EUA will remain  in effect (meaning this test can be used) for the duration of the COVID-19 declaration under Section 564(b)(1) of the Act, 21 U.S.C.section 360bbb-3(b)(1), unless the authorization is terminated  or revoked sooner.       Influenza A by PCR NEGATIVE NEGATIVE Final   Influenza B by PCR NEGATIVE NEGATIVE  Final    Comment: (NOTE) The Xpert Xpress SARS-CoV-2/FLU/RSV plus assay is intended as an aid in the diagnosis of influenza from Nasopharyngeal swab specimens and should not be used as a sole basis for treatment. Nasal washings and aspirates are unacceptable for Xpert Xpress SARS-CoV-2/FLU/RSV testing.  Fact Sheet for Patients: [place]  Fact Sheet for Healthcare Providers: BloggerCourse.com  This test is not yet approved or cleared by the [place] FDA and has been authorized for detection and/or diagnosis of SARS-CoV-2 by FDA under an Emergency Use Authorization (EUA). This EUA will remain in effect (meaning this test can be used) for the duration of the COVID-19 declaration under Section 564(b)(1) of the Act, 21 U.S.C. section 360bbb-3(b)(1), unless the authorization is terminated or revoked.  Performed at [facility], [phone]  [address] [place], [place] [id number]      Labs: BNP (last 3 results) No results for input(s): BNP in the last 8760 hours. Basic Metabolic Panel: Recent Labs  Lab 04/08/21 1935 04/09/21 0501  NA 136 134*  K 3.4* 3.4*  CL 96* 99  CO2 23 27  GLUCOSE 190* 86  BUN 7 7  CREATININE 0.77 0.67  CALCIUM 9.8 8.8*  MG 1.7  --    Liver Function Tests: Recent Labs  Lab 04/08/21 1935 04/09/21 0501  AST 158* 106*  ALT 90* 67*  ALKPHOS 71 56  BILITOT 1.9* 1.7*  PROT 7.8 6.5  ALBUMIN 4.6 3.8   Recent Labs  Lab 04/08/21 1935 04/09/21 0501 04/10/21 0508 04/11/21 0405  LIPASE 65* 55* 81* 72*   No results for input(s): AMMONIA in the last 168 hours. CBC: Recent Labs  Lab 04/08/21 1935 04/09/21 0501  WBC 6.7 5.7  HGB 14.9 12.8*  HCT 40.6 34.9*  MCV 92.1 92.8  PLT 93* 82*   Cardiac Enzymes: No results for input(s): CKTOTAL, CKMB, CKMBINDEX, TROPONINI in the last 168 hours. BNP: Invalid input(s): POCBNP CBG: No results for input(s): GLUCAP in the last 168  hours. D-Dimer No results for input(s): DDIMER in the last 72 hours. Hgb A1c No results for input(s): HGBA1C in the last 72 hours. Lipid Profile No results for input(s): CHOL, HDL, LDLCALC, TRIG, CHOLHDL, LDLDIRECT in the last 72 hours. Thyroid function studies No results for input(s): TSH, T4TOTAL, T3FREE, THYROIDAB in the last 72 hours.  Invalid input(s): FREET3 Anemia work up No results for input(s): VITAMINB12, FOLATE, FERRITIN, TIBC, IRON, RETICCTPCT in the last 72 hours. Urinalysis No results found for: COLORURINE, APPEARANCEUR, LABSPEC, PHURINE, GLUCOSEU, HGBUR, BILIRUBINUR, KETONESUR, PROTEINUR, UROBILINOGEN, NITRITE, LEUKOCYTESUR Sepsis Labs Invalid input(s): PROCALCITONIN,  WBC,  LACTICIDVEN Microbiology Recent Results (from the past 240 hour(s))  Resp Panel by RT-PCR (Flu A&B, Covid) Nasopharyngeal Swab     Status: None   Collection Time: 04/08/21  9:17 PM   Specimen: Nasopharyngeal Swab; Nasopharyngeal(NP) swabs in vial transport medium  Result Value Ref Range Status   SARS Coronavirus 2 by RT PCR NEGATIVE NEGATIVE Final    Comment: (NOTE) SARS-CoV-2 target nucleic acids are NOT DETECTED.  The SARS-CoV-2 RNA is generally detectable in upper respiratory specimens during the acute phase of infection. The lowest concentration of SARS-CoV-2 viral copies this assay can detect is 138 copies/mL. A negative result does not preclude SARS-Cov-2 infection and should not be used as the sole basis for treatment or other patient management decisions. A negative result may occur with  improper specimen collection/handling, submission of specimen other than nasopharyngeal swab, presence of viral mutation(s) within the areas targeted by this assay, and inadequate number of viral copies(<138 copies/mL). A negative result must be combined with clinical observations, patient history, and epidemiological information. The expected result is Negative.  Fact Sheet for Patients:   BloggerCourse.comhttps://www.fda.gov/media/152166/download  Fact Sheet for Healthcare Providers:  SeriousBroker.ithttps://www.fda.gov/media/152162/download  This test is no t yet approved or cleared by the [place] FDA and  has been authorized for detection and/or diagnosis of SARS-CoV-2 by FDA under an Emergency Use Authorization (EUA). This EUA will remain  in effect (meaning this test can be used) for the duration of the COVID-19 declaration under Section 564(b)(1) of the Act, 21 U.S.C.section 360bbb-3(b)(1), unless the authorization is terminated  or revoked sooner.       Influenza A by PCR NEGATIVE NEGATIVE Final   Influenza B by PCR NEGATIVE NEGATIVE  Final    Comment: (NOTE) The Xpert Xpress SARS-CoV-2/FLU/RSV plus assay is intended as an aid in the diagnosis of influenza from Nasopharyngeal swab specimens and should not be used as a sole basis for treatment. Nasal washings and aspirates are unacceptable for Xpert Xpress SARS-CoV-2/FLU/RSV testing.  Fact Sheet for Patients: BloggerCourse.com  Fact Sheet for Healthcare Providers: SeriousBroker.it  This test is not yet approved or cleared by the [place] FDA and has been authorized for detection and/or diagnosis of SARS-CoV-2 by FDA under an Emergency Use Authorization (EUA). This EUA will remain in effect (meaning this test can be used) for the duration of the COVID-19 declaration under Section 564(b)(1) of the Act, 21 U.S.C. section 360bbb-3(b)(1), unless the authorization is terminated or revoked.  Performed at [facility], [address] [place], [place] [address]      Time coordinating discharge: 40 minutes The [place] controlled substances registry was reviewed for this patient prior to filling the <5 days supply controlled substances script.    30 Day Unplanned Readmission Risk Score    Flowsheet Row ED to Hosp-Admission (Current) from 04/08/2021 in [facility] (1C)  30 Day Unplanned Readmission Risk Score (%) 7.35 Filed at 04/11/2021 0801       This score is the patient's risk of an unplanned readmission within 30 days of being discharged (0 -100%). The score is based on dignosis, age, lab data, medications, orders, and past utilization.   Low:  0-14.9   Medium: 15-21.9   High: 22-29.9   Extreme: 30 and above            SIGNED:   Alberteen Sam, MD  [facility] 04/11/2021, 11:56 AM

## 2021-05-26 DIAGNOSIS — N529 Male erectile dysfunction, unspecified: Secondary | ICD-10-CM | POA: Insufficient documentation

## 2022-06-25 DIAGNOSIS — G9340 Encephalopathy, unspecified: Secondary | ICD-10-CM | POA: Insufficient documentation

## 2022-06-25 DIAGNOSIS — J9601 Acute respiratory failure with hypoxia: Secondary | ICD-10-CM | POA: Insufficient documentation

## 2022-07-28 IMAGING — CT CT HEAD W/O CM
3 series · 16 of 47 positions shown, 19 images · non-contrast
Comparison: None.

CLINICAL DATA: Dizziness and head trauma

EXAM:
CT HEAD WITHOUT CONTRAST
TECHNIQUE: Contiguous axial images were obtained from the base of the skull
through the vertex without intravenous contrast.

[Series 3: head wo · axial · 0.42mm/px · z∈[-75,+50]mm · 10 of 30 slices shown, 13 images]
[im 3/30  brain]
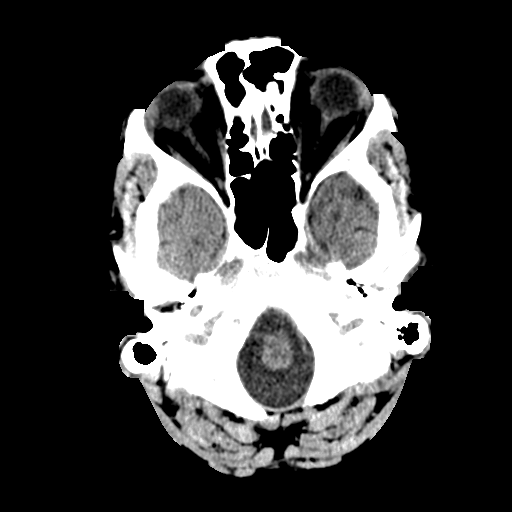
[im 3/30  bone]
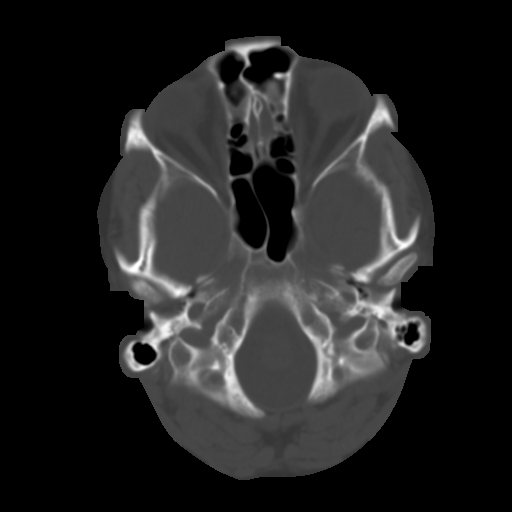
[im 6/30  brain]
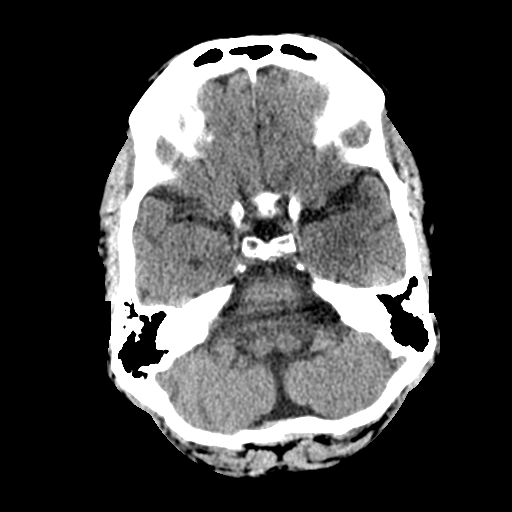
[im 9/30  brain]
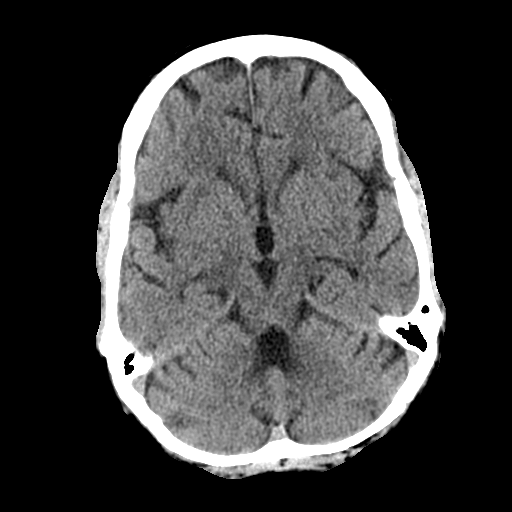
[im 11/30  brain]
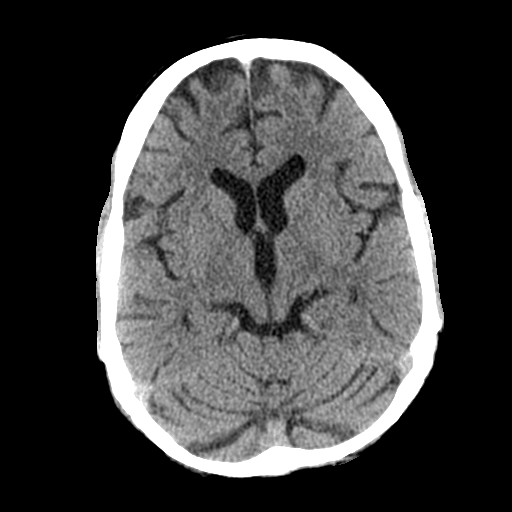
[im 14/30  brain]
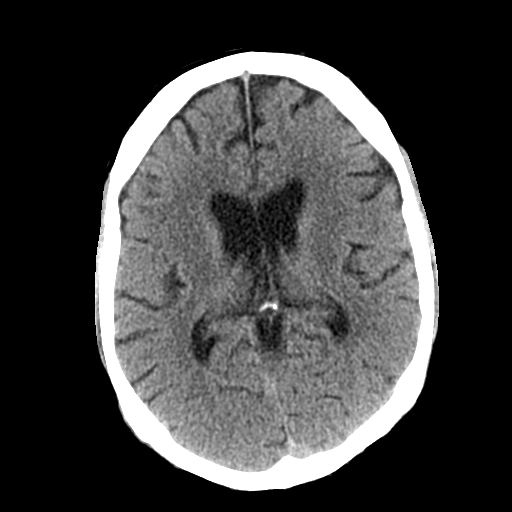
[im 14/30  bone]
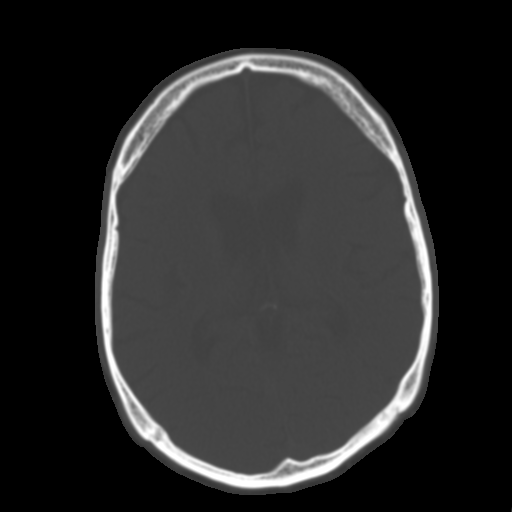
[im 17/30  brain]
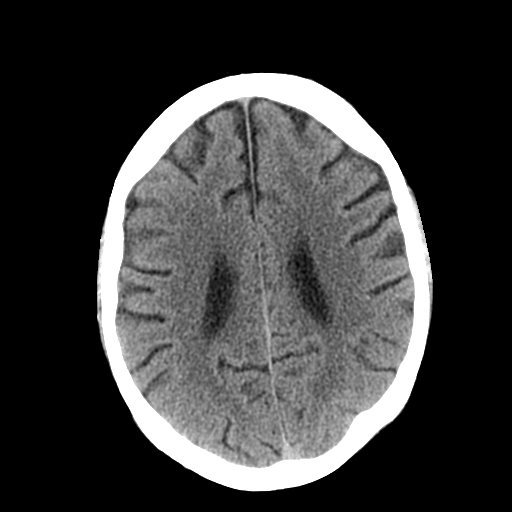
[im 20/30  brain]
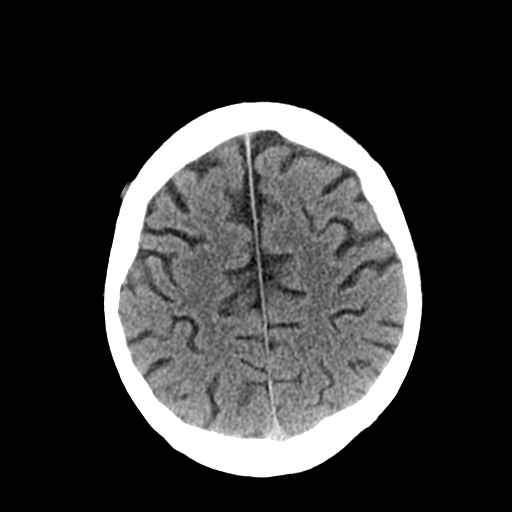
[im 23/30  brain]
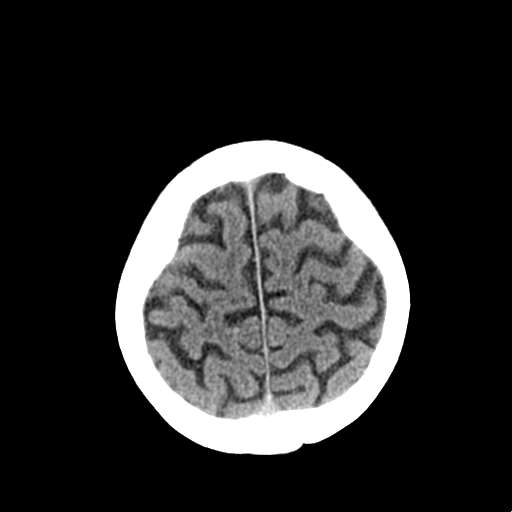
[im 25/30  brain]
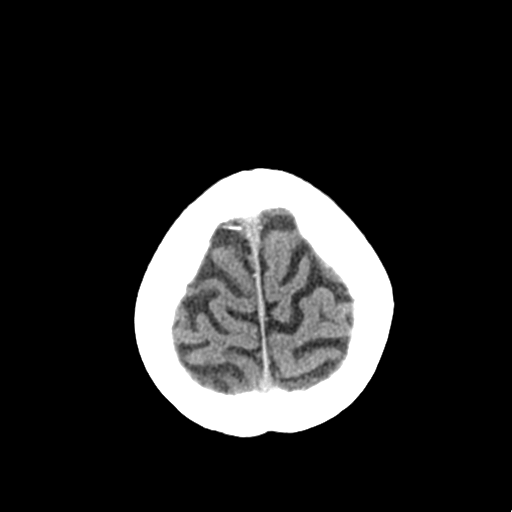
[im 25/30  bone]
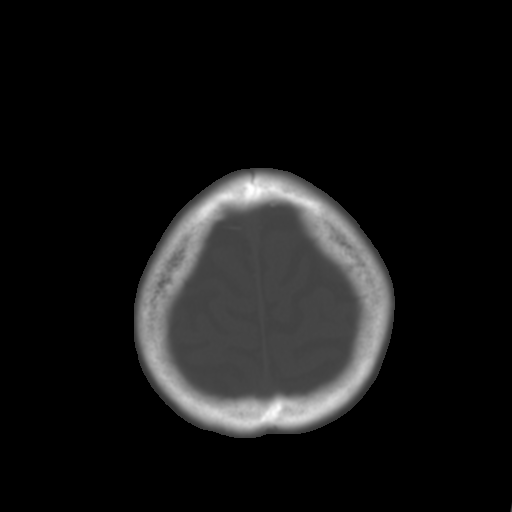
[im 28/30  brain]
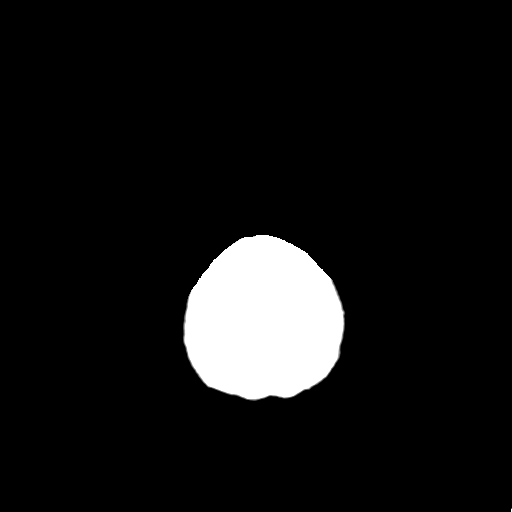

[Series 4: coronal soft tissue · coronal · 0.30mm/px · 3 of 65 slices shown]
[im 22/65  brain]
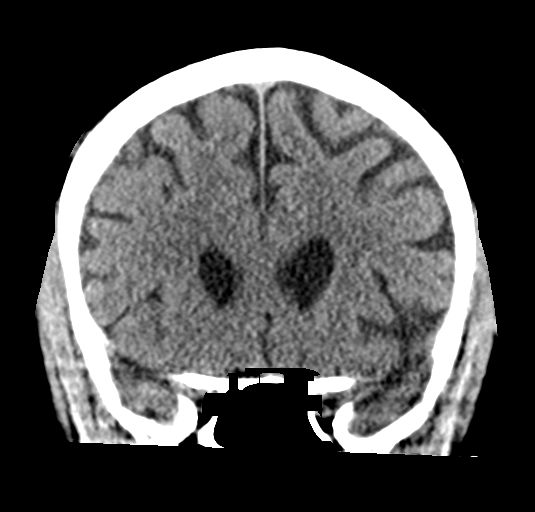
[im 29/65  brain]
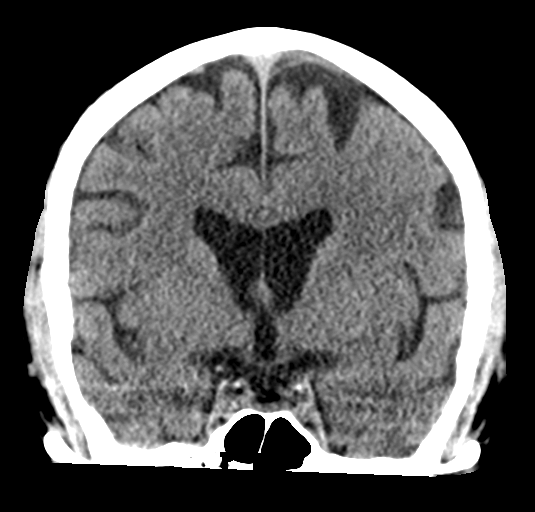
[im 36/65  brain]
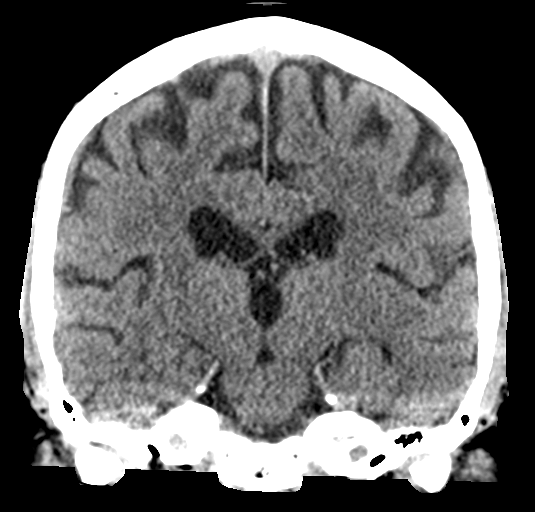

[Series 5: sagittal soft tissue · sagittal · 0.30mm/px · 3 of 54 slices shown]
[im 18/54  brain]
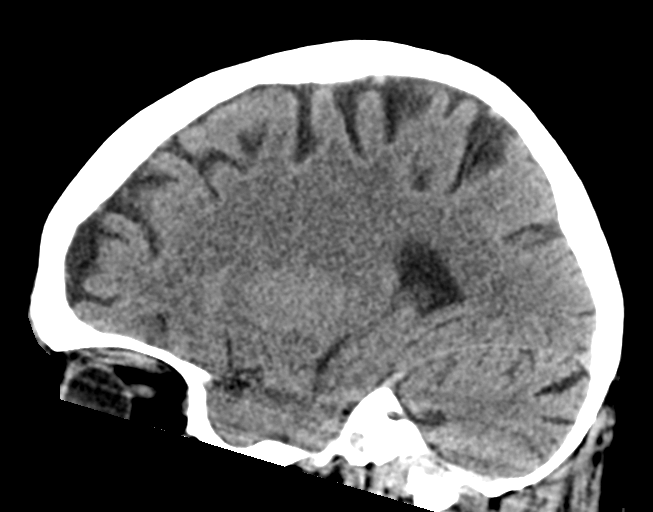
[im 27/54  brain]
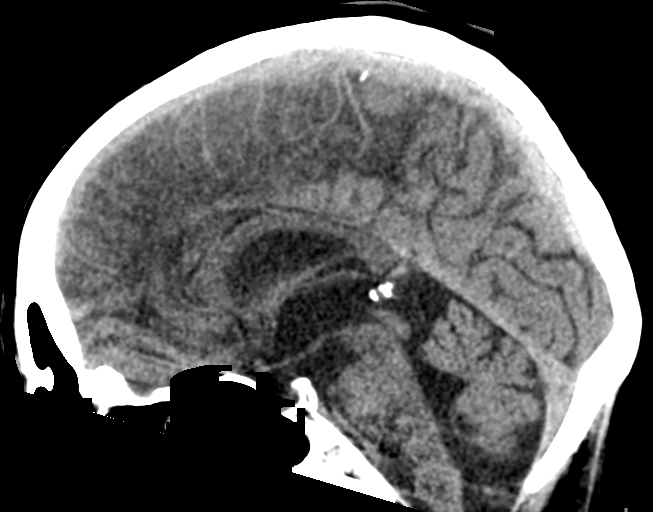
[im 36/54  brain]
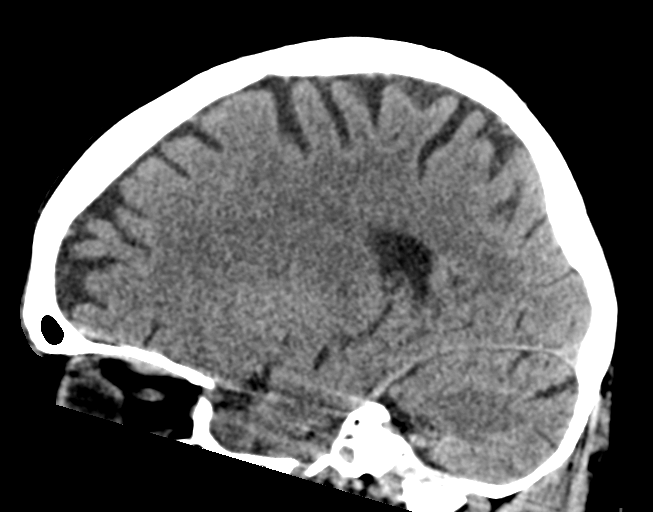

[16 of 47 positions shown; findings below may reference images not displayed]

FINDINGS: Brain: There is no mass, hemorrhage or extra-axial collection. The
size and configuration of the ventricles and extra-axial CSF spaces
are normal. The brain parenchyma is normal, without acute or chronic
infarction.

Vascular: No abnormal hyperdensity of the major intracranial
arteries or dural venous sinuses. No intracranial atherosclerosis.

Skull: The visualized skull base, calvarium and extracranial soft
tissues are normal.

Sinuses/Orbits: No fluid levels or advanced mucosal thickening of
the visualized paranasal sinuses. No mastoid or middle ear effusion.
The orbits are normal.
IMPRESSION: Normal head CT.

## 2022-10-28 ENCOUNTER — Encounter: Payer: Self-pay | Admitting: Emergency Medicine

## 2022-10-28 ENCOUNTER — Inpatient Hospital Stay
Admission: EM | Admit: 2022-10-28 | Discharge: 2022-10-31 | DRG: 896 | Disposition: A | Discharge status: Home / Self Care | Payer: BLUE CROSS/BLUE SHIELD | Attending: Internal Medicine | Admitting: Internal Medicine

## 2022-10-28 ENCOUNTER — Other Ambulatory Visit: Payer: Self-pay

## 2022-10-28 DIAGNOSIS — F10230 Alcohol dependence with withdrawal, uncomplicated: Secondary | ICD-10-CM | POA: Diagnosis not present

## 2022-10-28 DIAGNOSIS — Z811 Family history of alcohol abuse and dependence: Secondary | ICD-10-CM | POA: Diagnosis not present

## 2022-10-28 DIAGNOSIS — R748 Abnormal levels of other serum enzymes: Secondary | ICD-10-CM | POA: Insufficient documentation

## 2022-10-28 DIAGNOSIS — I10 Essential (primary) hypertension: Secondary | ICD-10-CM

## 2022-10-28 DIAGNOSIS — F10929 Alcohol use, unspecified with intoxication, unspecified: Secondary | ICD-10-CM | POA: Diagnosis not present

## 2022-10-28 DIAGNOSIS — Z7141 Alcohol abuse counseling and surveillance of alcoholic: Secondary | ICD-10-CM

## 2022-10-28 DIAGNOSIS — K760 Fatty (change of) liver, not elsewhere classified: Secondary | ICD-10-CM | POA: Diagnosis present

## 2022-10-28 DIAGNOSIS — F1020 Alcohol dependence, uncomplicated: Secondary | ICD-10-CM | POA: Diagnosis present

## 2022-10-28 DIAGNOSIS — Z6821 Body mass index (BMI) 21.0-21.9, adult: Secondary | ICD-10-CM

## 2022-10-28 DIAGNOSIS — E43 Unspecified severe protein-calorie malnutrition: Secondary | ICD-10-CM | POA: Diagnosis present

## 2022-10-28 DIAGNOSIS — F10229 Alcohol dependence with intoxication, unspecified: Secondary | ICD-10-CM | POA: Diagnosis not present

## 2022-10-28 DIAGNOSIS — Z79899 Other long term (current) drug therapy: Secondary | ICD-10-CM

## 2022-10-28 DIAGNOSIS — D696 Thrombocytopenia, unspecified: Secondary | ICD-10-CM | POA: Diagnosis present

## 2022-10-28 DIAGNOSIS — R7989 Other specified abnormal findings of blood chemistry: Secondary | ICD-10-CM

## 2022-10-28 DIAGNOSIS — F10930 Alcohol use, unspecified with withdrawal, uncomplicated: Principal | ICD-10-CM

## 2022-10-28 DIAGNOSIS — E8729 Other acidosis: Secondary | ICD-10-CM | POA: Diagnosis present

## 2022-10-28 DIAGNOSIS — E876 Hypokalemia: Secondary | ICD-10-CM | POA: Diagnosis present

## 2022-10-28 DIAGNOSIS — F10920 Alcohol use, unspecified with intoxication, uncomplicated: Secondary | ICD-10-CM | POA: Diagnosis not present

## 2022-10-28 DIAGNOSIS — E871 Hypo-osmolality and hyponatremia: Secondary | ICD-10-CM | POA: Insufficient documentation

## 2022-10-28 HISTORY — DX: Alcohol abuse, uncomplicated: F10.10

## 2022-10-28 HISTORY — DX: Hypomagnesemia: E83.42

## 2022-10-28 LAB — COMPREHENSIVE METABOLIC PANEL
ALT: 57 U/L — ABNORMAL HIGH (ref 0–44)
AST: 160 U/L — ABNORMAL HIGH (ref 15–41)
Albumin: 4.3 g/dL (ref 3.5–5.0)
Alkaline Phosphatase: 88 U/L (ref 38–126)
Anion gap: 19 — ABNORMAL HIGH (ref 5–15)
BUN: 7 mg/dL (ref 6–20)
CO2: 23 mmol/L (ref 22–32)
Calcium: 9 mg/dL (ref 8.9–10.3)
Chloride: 96 mmol/L — ABNORMAL LOW (ref 98–111)
Creatinine, Ser: 0.76 mg/dL (ref 0.61–1.24)
GFR, Estimated: >60 mL/min (ref >60)
Glucose, Bld: 120 mg/dL — ABNORMAL HIGH (ref 70–99)
Potassium: 2.9 mmol/L — ABNORMAL LOW (ref 3.5–5.1)
Sodium: 138 mmol/L (ref 135–145)
Total Bilirubin: 1.3 mg/dL — ABNORMAL HIGH (ref 0.3–1.2)
Total Protein: 7.9 g/dL (ref 6.5–8.1)

## 2022-10-28 LAB — URINE DRUG SCREEN, QUALITATIVE (ARMC ONLY)
Amphetamines, Ur Screen: NOT DETECTED
Barbiturates, Ur Screen: NOT DETECTED
Benzodiazepine, Ur Scrn: NOT DETECTED
Cannabinoid 50 Ng, Ur ~~LOC~~: NOT DETECTED
Cocaine Metabolite,Ur ~~LOC~~: NOT DETECTED
MDMA (Ecstasy)Ur Screen: NOT DETECTED
Methadone Scn, Ur: NOT DETECTED
Opiate, Ur Screen: NOT DETECTED
Phencyclidine (PCP) Ur S: NOT DETECTED
Tricyclic, Ur Screen: NOT DETECTED

## 2022-10-28 LAB — CBC
HCT: 44.3 % (ref 39.0–52.0)
Hemoglobin: 16.4 g/dL (ref 13.0–17.0)
MCH: 32.3 pg (ref 26.0–34.0)
MCHC: 37 g/dL — ABNORMAL HIGH (ref 30.0–36.0)
MCV: 87.4 fL (ref 80.0–100.0)
Platelets: 90 10*3/uL — ABNORMAL LOW (ref 150–400)
RBC: 5.07 MIL/uL (ref 4.22–5.81)
RDW: 13.3 % (ref 11.5–15.5)
WBC: 3.6 10*3/uL — ABNORMAL LOW (ref 4.0–10.5)
nRBC: 0 % (ref 0.0–0.2)

## 2022-10-28 LAB — MAGNESIUM: Magnesium: 1.6 mg/dL — ABNORMAL LOW (ref 1.7–2.4)

## 2022-10-28 LAB — LIPASE, BLOOD: Lipase: 72 U/L — ABNORMAL HIGH (ref 11–51)

## 2022-10-28 LAB — ETHANOL: Alcohol, Ethyl (B): 238 mg/dL — ABNORMAL HIGH (ref <10)

## 2022-10-28 MED ORDER — ADULT MULTIVITAMIN W/MINERALS CH
1.0000 | ORAL_TABLET | Freq: Every day | ORAL | Status: DC
  Administered 2022-10-28 – 2022-10-31 (×4): 1 via ORAL
  Filled 2022-10-28 (×4): qty 1

## 2022-10-28 MED ORDER — PHENOBARBITAL SODIUM 130 MG/ML IJ SOLN
130.0000 mg | Freq: Once | INTRAMUSCULAR | Status: AC
  Administered 2022-10-28: 130 mg via INTRAVENOUS
  Filled 2022-10-28: qty 1

## 2022-10-28 MED ORDER — THIAMINE MONONITRATE 100 MG PO TABS
100.0000 mg | ORAL_TABLET | Freq: Every day | ORAL | Status: DC
  Administered 2022-10-29 – 2022-10-31 (×3): 100 mg via ORAL
  Filled 2022-10-28 (×4): qty 1

## 2022-10-28 MED ORDER — POTASSIUM CHLORIDE CRYS ER 20 MEQ PO TBCR
40.0000 meq | EXTENDED_RELEASE_TABLET | Freq: Once | ORAL | Status: AC
  Administered 2022-10-28: 40 meq via ORAL
  Filled 2022-10-28: qty 2

## 2022-10-28 MED ORDER — THIAMINE HCL 100 MG/ML IJ SOLN
100.0000 mg | Freq: Every day | INTRAMUSCULAR | Status: DC
  Administered 2022-10-28: 100 mg via INTRAVENOUS
  Filled 2022-10-28: qty 2

## 2022-10-28 MED ORDER — ENOXAPARIN SODIUM 40 MG/0.4ML IJ SOSY
40.0000 mg | PREFILLED_SYRINGE | INTRAMUSCULAR | Status: DC
  Administered 2022-10-29 (×2): 40 mg via SUBCUTANEOUS
  Filled 2022-10-28 (×2): qty 0.4

## 2022-10-28 MED ORDER — ACETAMINOPHEN 325 MG PO TABS
650.0000 mg | ORAL_TABLET | Freq: Three times a day (TID) | ORAL | Status: DC | PRN
  Administered 2022-10-28: 650 mg via ORAL
  Filled 2022-10-28: qty 2

## 2022-10-28 MED ORDER — LORAZEPAM 2 MG PO TABS: 0.0000 mg | ORAL_TABLET | Freq: Two times a day (BID) | ORAL | Status: DC

## 2022-10-28 MED ORDER — ACETAMINOPHEN 650 MG RE SUPP: 650.0000 mg | Freq: Three times a day (TID) | RECTAL | Status: DC | PRN

## 2022-10-28 MED ORDER — PHENOBARBITAL SODIUM 65 MG/ML IJ SOLN: 260.0000 mg | Freq: Once | INTRAMUSCULAR | Status: DC

## 2022-10-28 MED ORDER — POTASSIUM CHLORIDE 10 MEQ/100ML IV SOLN: 10.0000 meq | INTRAVENOUS | Status: DC

## 2022-10-28 MED ORDER — CLONIDINE HCL 0.1 MG PO TABS
0.1000 mg | ORAL_TABLET | Freq: Two times a day (BID) | ORAL | Status: DC
  Administered 2022-10-28 – 2022-10-31 (×6): 0.1 mg via ORAL
  Filled 2022-10-28 (×6): qty 1

## 2022-10-28 MED ORDER — FOLIC ACID 1 MG PO TABS
1.0000 mg | ORAL_TABLET | Freq: Every day | ORAL | Status: DC
  Administered 2022-10-28 – 2022-10-31 (×4): 1 mg via ORAL
  Filled 2022-10-28 (×4): qty 1

## 2022-10-28 MED ORDER — LORAZEPAM 2 MG/ML IJ SOLN
0.0000 mg | Freq: Four times a day (QID) | INTRAMUSCULAR | Status: AC
  Administered 2022-10-28 – 2022-10-29 (×2): 1 mg via INTRAVENOUS
  Filled 2022-10-28 (×2): qty 1

## 2022-10-28 MED ORDER — ONDANSETRON HCL 4 MG/2ML IJ SOLN: 4.0000 mg | Freq: Four times a day (QID) | INTRAMUSCULAR | Status: DC | PRN

## 2022-10-28 MED ORDER — DIAZEPAM 5 MG/ML IJ SOLN
10.0000 mg | Freq: Once | INTRAMUSCULAR | Status: AC
  Administered 2022-10-28: 10 mg via INTRAVENOUS
  Filled 2022-10-28: qty 2

## 2022-10-28 MED ORDER — POTASSIUM CHLORIDE 10 MEQ/100ML IV SOLN
10.0000 meq | INTRAVENOUS | Status: AC
  Administered 2022-10-28 (×2): 10 meq via INTRAVENOUS
  Filled 2022-10-28: qty 100

## 2022-10-28 MED ORDER — ONDANSETRON HCL 4 MG/2ML IJ SOLN
4.0000 mg | Freq: Once | INTRAMUSCULAR | Status: AC
  Administered 2022-10-28: 4 mg via INTRAVENOUS
  Filled 2022-10-28: qty 2

## 2022-10-28 MED ORDER — ONDANSETRON HCL 4 MG PO TABS: 4.0000 mg | ORAL_TABLET | Freq: Four times a day (QID) | ORAL | Status: DC | PRN

## 2022-10-28 MED ORDER — LORAZEPAM 2 MG/ML IJ SOLN: 0.0000 mg | Freq: Two times a day (BID) | INTRAMUSCULAR | Status: DC

## 2022-10-28 MED ORDER — PHENOBARBITAL SODIUM 130 MG/ML IJ SOLN
130.0000 mg | Freq: Once | INTRAMUSCULAR | Status: DC | PRN
  Filled 2022-10-28: qty 1

## 2022-10-28 MED ORDER — DEXTROSE-NACL 5-0.9 % IV SOLN
INTRAVENOUS | Status: DC
  Administered 2022-10-28 – 2022-10-29 (×2): 1000 mL via INTRAVENOUS

## 2022-10-28 MED ORDER — SODIUM CHLORIDE 0.9 % IV BOLUS
1000.0000 mL | Freq: Once | INTRAVENOUS | Status: AC
  Administered 2022-10-28: 1000 mL via INTRAVENOUS

## 2022-10-28 MED ORDER — LORAZEPAM 2 MG/ML IJ SOLN
4.0000 mg | Freq: Once | INTRAMUSCULAR | Status: AC
  Administered 2022-10-28: 4 mg via INTRAVENOUS
  Filled 2022-10-28: qty 2

## 2022-10-28 MED ORDER — MAGNESIUM SULFATE 4 GM/100ML IV SOLN
4.0000 g | Freq: Once | INTRAVENOUS | Status: AC
  Administered 2022-10-28: 4 g via INTRAVENOUS
  Filled 2022-10-28: qty 100

## 2022-10-28 MED ORDER — LORAZEPAM 2 MG PO TABS: 0.0000 mg | ORAL_TABLET | Freq: Four times a day (QID) | ORAL | Status: AC

## 2022-10-28 MED ORDER — ONDANSETRON HCL 4 MG PO TABS: 4.0000 mg | ORAL_TABLET | Freq: Every day | ORAL | 1 refills | Status: DC | PRN

## 2022-10-28 NOTE — Assessment & Plan Note (Addendum)
Secondary to alcohol abuse.  Will check right upper quadrant ultrasound tomorrow morning to check to see if he has cirrhosis of the liver.  Check an INR.

## 2022-10-28 NOTE — ED Provider Notes (Signed)
[facility] Provider Note    Event Date/Time   First MD Initiated Contact with Patient 10/28/22 1336     (approximate)   History   Withdrawal   HPI  Billy Wood is a 42 y.o. male   Past medical history of alcohol use with  alcohol withdrawal seizures here for alcohol withdrawal.  He drinks typically liquor in the amount of 2 755ml bottles per day and this morning he awoke to find that his family had taken his liquor away and began to feel like he was having withdrawal.  His last drink was last night.  He does not use drugs.  Has been nauseous tremulous and feels his heart racing.  He has not had a seizure.  He has no other acute medical complaints.  He says that he is willing to consider detox programs to stop drinking.  Independent Historian contributed to assessment above: His mother who is at bedside  External Medical Documents Reviewed: Discharge summary dated September 2022 for alcohol withdrawal      Physical Exam   Triage Vital Signs: ED Triage Vitals [10/28/22 1322]  Enc Vitals Group     BP (!) 147/95     Pulse Rate (!) 112     Resp 18     Temp 98.2 F (36.8 C)     Temp Source Oral     SpO2 97 %     Weight      Height      Head Circumference      Peak Flow      Pain Score 10     Pain Loc      Pain Edu?      Excl. in [place]?     Most recent vital signs: Vitals:   10/28/22 1324 10/28/22 1400  BP: (!) 147/95 (!) 148/98  Pulse: (!) 107 92  Resp:  20  Temp:    SpO2:  97%    General: Awake, he appears to be in acute alcohol withdrawal with tachycardia tremors and tongue fasciculation. CV:  Good peripheral perfusion.  Resp:  Normal effort.  Abd:  No distention.  Tender to epigastrum   ED Results / Procedures / Treatments   Labs (all labs ordered are listed, but only abnormal results are displayed) Labs Reviewed  COMPREHENSIVE METABOLIC PANEL - Abnormal; Notable for the following components:      Result Value   Potassium  2.9 (*)    Chloride 96 (*)    Glucose, Bld 120 (*)    AST 160 (*)    ALT 57 (*)    Total Bilirubin 1.3 (*)    Anion gap 19 (*)    All other components within normal limits  ETHANOL - Abnormal; Notable for the following components:   Alcohol, Ethyl (B) 238 (*)    All other components within normal limits  CBC - Abnormal; Notable for the following components:   WBC 3.6 (*)    MCHC 37.0 (*)    Platelets 90 (*)    All other components within normal limits  URINE DRUG SCREEN, QUALITATIVE ([facility] ONLY)  LIPASE, BLOOD  MAGNESIUM     I ordered and reviewed the above labs they are notable for potassium is low at 2.9 alcohol level is in the 200s and his AST and ALT are chronically elevated unchanged from prior.   PROCEDURES:  Critical Care performed: No  Procedures   MEDICATIONS ORDERED IN ED: Medications  potassium chloride 10 mEq in  100 mL IVPB (10 mEq Intravenous New Bag/Given 10/28/22 1557)  diazepam (VALIUM) injection 10 mg (10 mg Intravenous Given 10/28/22 1343)  sodium chloride 0.9 % bolus 1,000 mL (0 mLs Intravenous Stopped 10/28/22 1557)  ondansetron (ZOFRAN) injection 4 mg (4 mg Intravenous Given 10/28/22 1344)  PHENObarbital (LUMINAL) injection 130 mg (130 mg Intravenous Given 10/28/22 1430)    Followed by  PHENObarbital (LUMINAL) injection 130 mg (130 mg Intravenous Given 10/28/22 1427)  sodium chloride 0.9 % bolus 1,000 mL (1,000 mLs Intravenous Bolus 10/28/22 1557)     IMPRESSION / MDM / ASSESSMENT AND PLAN / ED COURSE  I reviewed the triage vital signs and the nursing notes.                                Patient's presentation is most consistent with acute presentation with potential threat to life or bodily function.  Differential diagnosis includes, but is not limited to, alcohol withdrawal, alcoholic pancreatitis, intra-abdominal infection, electrolyte derangement,   The patient is on the cardiac monitor to evaluate for evidence of arrhythmia and/or  significant heart rate changes.  MDM: This is a patient with alcohol withdrawal who was medicated with 10 mg of Valium as well as 260 mg of phenobarbital with very good effect.  Tremors fasciculations and tachycardia now resolved and he is sleeping comfortably.  His abdomen is soft and nontender on reevaluation.  No longer feeling nauseated.  His potassium was low and being repleted.  He is getting IV crystalloid bolus.  He is still on the fence about detox.  His family is encouraging him to detox.  He was given resources for detox.  I considered hospitalization for admission or observation however given the patient is now comfortable and no longer exhibiting symptoms of acute alcohol withdrawal and has a long-acting medication and phenobarbital that will self taper over the next couple of days I think outpatient follow-up and monitoring most appropriate this time.  Patient is in agreement.  He will call detox programs in the next several days.  He understands to come back to the emergency department if he exhibits symptoms of acute alcohol withdrawal once more.          FINAL CLINICAL IMPRESSION(S) / ED DIAGNOSES   Final diagnoses:  Alcohol withdrawal syndrome without complication ([place])     Rx / DC Orders   ED Discharge Orders          Ordered    ondansetron (ZOFRAN) 4 MG tablet  Daily PRN        10/28/22 1538             Note:  This document was prepared using Dragon voice recognition software and may include unintentional dictation errors.    Lucillie Garfinkel, MD 10/28/22 [phone]

## 2022-10-28 NOTE — ED Notes (Signed)
Pt presents to ED with c/o of ETOH withdrawal. Pt is tachycardiac, shaky, flushed and agitated on arrival. Pt states he drinks 1-2 liters or ETOH (liquor) daily but missed his "drinking time" today.   Family wants pt to get detox but pt doesn't appear to want that at this time. Pt is scared for seizures and requests meds for that but is not able to give a straight answer if he wants to fully be detoxed and quit. Pt does endorse HX of withdrawal seizures.

## 2022-10-28 NOTE — ED Triage Notes (Signed)
Patient to ED for withdrawal from alcohol. Last drank last night. States he drinks approx 2 L of liquor a day. Patient shaking in triage and states he has been dry heaving. Hx of withdrawal seizures.

## 2022-10-28 NOTE — Progress Notes (Incomplete)
       CROSS COVER NOTE  NAME: Billy Wood MRN: [id number] DOB : 12/20/1980 ATTENDING PHYSICIAN: Loletha Grayer, MD    Date of Service   10/28/2022   HPI/Events of Note   Report ***pharmacy contact On Review of chart *** Bedside eval*** HPI***  Interventions   Assessment/Plan: Phenobarb 4 mg IV ativan now X    *** professional thanks      To reach the provider On-Call:   7AM- 7PM see care teams to locate the attending and reach out to them via [email or web address] Password: [email or web address] 7PM-7AM contact night-coverage If you still have difficulty reaching the appropriate provider, please page the [facility] (Director on Call) for [facility] on amion for assistance  This document was prepared using Systems analyst and may include unintentional dictation errors.  Neomia Glass DNP, MBA, FNP-BC, PMHNP-BC Nurse Practitioner [facility] Pager [phone]

## 2022-10-28 NOTE — Assessment & Plan Note (Signed)
Will place on clonidine for right now which may also help out with alcohol withdrawal

## 2022-10-28 NOTE — ED Provider Notes (Addendum)
-----------------------------------------   4:42 PM on 10/28/2022 -----------------------------------------  I took over care of this patient from Dr. Jacelyn Grip.  Lipase is minimally elevated, which appears baseline for the patient.  Magnesium is just below reference range and there is no indication for IV repletion at this time.  The patient's vital signs are stable.  He is appropriate for discharge at this time as planned by Dr. Jacelyn Grip.  Verbal counseling and discharge instructions were already provided by Dr. Jacelyn Grip and I have provided written discharge instructions and return precautions.   ----------------------------------------- 5:21 PM on 10/28/2022 -----------------------------------------  The patient's girlfriend is now here and after further discussion, he feels that his withdrawal symptoms are worse and he does not feel comfortable going home.  On reassessment the patient is alert and oriented but appears tremulous and is slightly tachycardic.  I agree that he will likely go into withdrawal if discharge and he is willing to stay for withdrawal treatment.  I have ordered medication per the CIWA protocol and will consult hospitalist for admission.  ----------------------------------------- 5:31 PM on 10/28/2022 -----------------------------------------  I consulted Dr. Leslye Peer from the hospitalist service; based her discussion he agrees to admit the patient.   Arta Silence, MD 10/28/22 1731

## 2022-10-28 NOTE — H&P (Signed)
History and Physical    Patient: Billy Wood [id number] DOB: 04-Feb-1981 DOA: 10/28/2022 DOS: the patient was seen and examined on 10/28/2022 PCP: Latanya Maudlin, NP  Patient coming from: Home  Chief Complaint:  Chief Complaint  Patient presents with   Withdrawal   HPI: Billy Wood is a 42 y.o. male with medical history significant of severe alcoholism for 20 years.  He states he drinks 2 fifths of vodka per day.  He has not eaten anything in 3 days.  He vomits every morning.  Looking to stop drinking at this point.  In the ER found to have low potassium and low magnesium.  Hospitalist services were contacted for further evaluation.  The patient states that he has had seizures while trying to come off alcohol. Review of Systems: Review of Systems  Constitutional:  Positive for chills and malaise/fatigue. Negative for fever.  HENT:  Negative for hearing loss.   Eyes:  Negative for blurred vision.  Respiratory:  Negative for cough and shortness of breath.   Cardiovascular:  Negative for chest pain.  Gastrointestinal:  Positive for nausea and vomiting. Negative for abdominal pain, constipation and diarrhea.  Genitourinary:  Negative for dysuria.  Musculoskeletal:  Negative for myalgias.  Skin:  Positive for itching and rash.  Neurological:  Negative for dizziness.  Endo/Heme/Allergies:  Does not bruise/bleed easily.  Psychiatric/Behavioral:  Negative for depression.     Past Medical History:  Diagnosis Date   Alcohol abuse    Substance abuse ([place])    Past Surgical History:  Procedure Laterality Date   NO PAST SURGERIES     Social History:  reports that he has never smoked. He has never used smokeless tobacco. He reports current alcohol use of about 126.0 - 168.0 standard drinks of alcohol per week. He reports that he does not use drugs.  No Known Allergies  Family History  Problem Relation Age of Onset   Alcohol abuse Mother    Alcohol abuse Father     Prior to  Admission medications   Medication Sig Start Date End Date Taking? Authorizing Provider  ondansetron (ZOFRAN) 4 MG tablet Take 1 tablet (4 mg total) by mouth daily as needed for nausea or vomiting. 10/28/22 10/28/23 Yes Lucillie Garfinkel, MD    Physical Exam: Vitals:   10/28/22 1324 10/28/22 1400 10/28/22 1600 10/28/22 1700  BP: (!) 147/95 (!) 148/98 (!) 146/94 (!) 153/106  Pulse: (!) 107 92 (!) 102 (!) 115  Resp:  20 19 19   Temp:      TempSrc:      SpO2:  97% 99% 99%   Physical Exam HENT:     Head: Normocephalic.     Mouth/Throat:     Pharynx: No oropharyngeal exudate.  Eyes:     General: Lids are normal.     Conjunctiva/sclera: Conjunctivae normal.  Cardiovascular:     Rate and Rhythm: Normal rate and regular rhythm.     Heart sounds: Normal heart sounds, S1 normal and S2 normal.  Pulmonary:     Breath sounds: No decreased breath sounds, wheezing, rhonchi or rales.  Abdominal:     Palpations: Abdomen is soft.     Tenderness: There is no abdominal tenderness.  Musculoskeletal:     Right lower leg: No swelling.     Left lower leg: No swelling.  Skin:    General: Skin is warm.     Findings: No rash.  Neurological:     Mental Status: He is alert and oriented  to person, place, and time.     Comments: Slight tremor.     Data Reviewed: Urine toxicology negative.  Alcohol level 238, potassium 2.9, creatinine 0.76, lipase 72, AST 160, ALT 57, total bilirubin 1.3, white blood cell count 3.6, hemoglobin 16.4, platelet count 90  Assessment and Plan: * Alcohol intoxication ([place]) Patient with alcohol level of 238.  Patient drinks 2 fifths per day.  High risk of withdrawal and delirium tremens.  Put on alcohol withdrawal protocol.  Alcoholic ketoacidosis IV fluid hydration with D5.  IV thiamine first.  Anion gap 19.  Essential hypertension Will place on clonidine for right now which may also help out with alcohol withdrawal  Hypomagnesemia 4 g IV magnesium today.  Recheck level  tomorrow.  Hypokalemia Patient received a few IV rounds of potassium will give oral replacement also.  Thrombocytopenia (HCC) Chronic in nature, likely secondary to 20 years of alcohol abuse.  Will check hepatitis C.  Elevated liver function tests Secondary to alcohol abuse.  Will check right upper quadrant ultrasound tomorrow morning to check to see if he has cirrhosis of the liver.  Check an INR.  Elevated lipase Likely reactive.  No abdominal pain.      Advance Care Planning: Full code  Consults: None  Family Communication: Permission to speak in front of girlfriend at bedside  Severity of Illness: The appropriate patient status for this patient is INPATIENT. Inpatient status is judged to be reasonable and necessary in order to provide the required intensity of service to ensure the patient's safety. The patient's presenting symptoms, physical exam findings, and initial radiographic and laboratory data in the context of their chronic comorbidities is felt to place them at high risk for further clinical deterioration. Furthermore, it is not anticipated that the patient will be medically stable for discharge from the hospital within 2 midnights of admission.   * I certify that at the point of admission it is my clinical judgment that the patient will require inpatient hospital care spanning beyond 2 midnights from the point of admission due to high intensity of service, high risk for further deterioration and high frequency of surveillance required.*  Author: Loletha Grayer, MD 10/28/2022 6:12 PM  For on call review [email or web address].

## 2022-10-28 NOTE — Assessment & Plan Note (Addendum)
IV fluid hydration with D5.  IV thiamine first.  Anion gap 19.

## 2022-10-28 NOTE — Discharge Instructions (Addendum)
Take Zofran as needed for nausea and vomiting. The phenobarbital that you received should slowly come down over the next few days and not lead to withdrawal.  When this medication is in your system you must be very careful about adding more alcohol to your system as this may make you excessively sleepy or intoxicated.  We talked about extra medications to avoid withdrawal, but the medication you received today is long-acting and I do not want you taking extra medications to risk over-sedation.  If you experience symptoms of severe withdrawal call 911 or come back to the emergency department.    Thank you for choosing [place] for your health care today!  Please see your primary doctor this week for a follow up appointment.   Sometimes, in the early stages of certain disease courses it is difficult to detect in the emergency department evaluation -- so, it is important that you continue to monitor your symptoms and call your doctor right away or return to the emergency department if you develop any new or worsening symptoms.  Please go to the following website to schedule new (and existing) patient appointments:   [email or web address]  If you do not have a primary doctor try calling the following clinics to establish care:  If you have insurance:  [facility] [phone] [place] [address]   [facility]  [phone] [place] [address]   If you do not have insurance:  [facility]  [phone] [address] [place] [address]   The following is another list of primary care offices in the area who are accepting new patients at this time.  Please reach out to one of them directly and let them know you would like to schedule an appointment to follow up on an Emergency Department visit, and/or to establish a new primary care provider (PCP).  There are likely other primary care  clinics in the are who are accepting new patients, but this is an excellent place to start:  [place] physician: Dr Lavon Paganini [address] [place], [place] [address] [phone]  [facility] Lead Physician: Dr Steele Sizer [address], [place], [place] [address] [phone]  [place] Physician: Dr Park Liter [address] [place], [place] [address] [phone]  [facility] Lead Physician: Dr Dewaine Oats [place], [place], [place] [address] [phone]  [place] [facility] [place] Physician: Dr Halina Maidens [address] [place] [address] [place], [place] [address] [phone]   It was my pleasure to care for you today.   Hoover Brunette Jacelyn Grip, MD    Intensive Outpatient Programs   [place] [address] [place],  [place], [place] (Inpatient and outpatient)[phone] (Suboxone and Methadone) [address] [phone]  [facility]: [facility] Programs - Intensive Outpatient [place] [phone] [place], [place], [place] (Outpatient, Inpatient, [facility] (Groups and [place]idental) (insurance only) [place], [place]   [facility] (for caregivers and family) [address], [place], [place]  Residential Treatment Programs  [place](2 years) Residential: 18 days)[facility] ([place] [address]  [place], [place], [place] or  [phone]  [facility] [address] [place], [place], [place]  [place] (RTS) [place], [place], [place] Admissions: 8am-3pm M-F  [facility] Program: Residential Program [phone] Days)             [facility]: [facility]  [place], [place], [place] or [phone] in Hours over the weekend or by referral)   [place] Crisis: [facility]:[phone] (for crisis response 24 hours a day)

## 2022-10-28 NOTE — ED Notes (Signed)
When this RN went to hourly round and change new bag of K+, S/O started yelling at this RN stating "He can't go home home like this". S/O educated the MD and pt had made the decision together, EDP asked to come to bedside to re-eval pt.   Dr. Cherylann Banas at bedside

## 2022-10-28 NOTE — Assessment & Plan Note (Signed)
Chronic in nature, likely secondary to 20 years of alcohol abuse.  Will check hepatitis C.

## 2022-10-28 NOTE — Assessment & Plan Note (Signed)
Patient received a few IV rounds of potassium will give oral replacement also.

## 2022-10-28 NOTE — Assessment & Plan Note (Signed)
Likely reactive.  No abdominal pain.

## 2022-10-28 NOTE — Assessment & Plan Note (Signed)
4 g IV magnesium today.  Recheck level tomorrow.

## 2022-10-28 NOTE — ED Notes (Signed)
Dr. Weiting at bedside.  

## 2022-10-28 NOTE — Assessment & Plan Note (Addendum)
Patient with alcohol level of 238.  Patient drinks 2 fifths per day.  High risk of withdrawal and delirium tremens.  Put on alcohol withdrawal protocol.

## 2022-10-28 NOTE — Assessment & Plan Note (Signed)
>>  ASSESSMENT AND PLAN FOR ALCOHOL INTOXICATION (HCC) WRITTEN ON 10/28/2022  6:10 PM BY Alford Highland, MD  Patient with alcohol level of 238.  Patient drinks 2 fifths per day.  High risk of withdrawal and delirium tremens.  Put on alcohol withdrawal protocol.

## 2022-10-29 ENCOUNTER — Inpatient Hospital Stay: Payer: BLUE CROSS/BLUE SHIELD

## 2022-10-29 DIAGNOSIS — F10920 Alcohol use, unspecified with intoxication, uncomplicated: Secondary | ICD-10-CM

## 2022-10-29 LAB — PROTIME-INR
INR: 1.1 (ref 0.8–1.2)
Prothrombin Time: 14 seconds (ref 11.4–15.2)

## 2022-10-29 LAB — APTT: aPTT: 35 seconds (ref 24–36)

## 2022-10-29 LAB — CBC
HCT: 39.8 % (ref 39.0–52.0)
Hemoglobin: 14.2 g/dL (ref 13.0–17.0)
MCH: 32.4 pg (ref 26.0–34.0)
MCHC: 35.7 g/dL (ref 30.0–36.0)
MCV: 90.9 fL (ref 80.0–100.0)
Platelets: 62 10*3/uL — ABNORMAL LOW (ref 150–400)
RBC: 4.38 MIL/uL (ref 4.22–5.81)
RDW: 13.1 % (ref 11.5–15.5)
WBC: 3.2 10*3/uL — ABNORMAL LOW (ref 4.0–10.5)
nRBC: 0 % (ref 0.0–0.2)

## 2022-10-29 LAB — COMPREHENSIVE METABOLIC PANEL
ALT: 43 U/L (ref 0–44)
AST: 114 U/L — ABNORMAL HIGH (ref 15–41)
Albumin: 3.4 g/dL — ABNORMAL LOW (ref 3.5–5.0)
Alkaline Phosphatase: 67 U/L (ref 38–126)
Anion gap: 5 (ref 5–15)
BUN: <5 mg/dL — ABNORMAL LOW (ref 6–20)
CO2: 26 mmol/L (ref 22–32)
Calcium: 7.9 mg/dL — ABNORMAL LOW (ref 8.9–10.3)
Chloride: 102 mmol/L (ref 98–111)
Creatinine, Ser: 0.61 mg/dL (ref 0.61–1.24)
GFR, Estimated: >60 mL/min (ref >60)
Glucose, Bld: 94 mg/dL (ref 70–99)
Potassium: 3.2 mmol/L — ABNORMAL LOW (ref 3.5–5.1)
Sodium: 133 mmol/L — ABNORMAL LOW (ref 135–145)
Total Bilirubin: 1.8 mg/dL — ABNORMAL HIGH (ref 0.3–1.2)
Total Protein: 6.4 g/dL — ABNORMAL LOW (ref 6.5–8.1)

## 2022-10-29 LAB — MAGNESIUM: Magnesium: 1.9 mg/dL (ref 1.7–2.4)

## 2022-10-29 LAB — HEPATITIS C ANTIBODY: HCV Ab: NONREACTIVE

## 2022-10-29 LAB — HIV ANTIBODY (ROUTINE TESTING W REFLEX): HIV Screen 4th Generation wRfx: NONREACTIVE

## 2022-10-29 LAB — PHOSPHORUS: Phosphorus: 1.8 mg/dL — ABNORMAL LOW (ref 2.5–4.6)

## 2022-10-29 LAB — HEPATITIS B CORE ANTIBODY, TOTAL: Hep B Core Total Ab: NONREACTIVE

## 2022-10-29 LAB — GLUCOSE, CAPILLARY: Glucose-Capillary: 90 mg/dL (ref 70–99)

## 2022-10-29 LAB — HEPATITIS B SURFACE ANTIGEN: Hepatitis B Surface Ag: NONREACTIVE

## 2022-10-29 MED ORDER — LORAZEPAM 2 MG/ML IJ SOLN: 2.0000 mg | Freq: Every day | INTRAMUSCULAR | Status: DC

## 2022-10-29 MED ORDER — PANTOPRAZOLE SODIUM 40 MG PO TBEC
40.0000 mg | DELAYED_RELEASE_TABLET | Freq: Two times a day (BID) | ORAL | Status: DC
  Administered 2022-10-29 – 2022-10-31 (×5): 40 mg via ORAL
  Filled 2022-10-29 (×5): qty 1

## 2022-10-29 MED ORDER — LORAZEPAM 2 MG/ML IJ SOLN
2.0000 mg | Freq: Three times a day (TID) | INTRAMUSCULAR | Status: AC
  Administered 2022-10-30 (×3): 2 mg via INTRAVENOUS
  Filled 2022-10-29 (×3): qty 1

## 2022-10-29 MED ORDER — LORAZEPAM 2 MG/ML IJ SOLN
2.0000 mg | Freq: Four times a day (QID) | INTRAMUSCULAR | Status: AC
  Administered 2022-10-29 (×2): 2 mg via INTRAVENOUS
  Filled 2022-10-29 (×2): qty 1

## 2022-10-29 MED ORDER — LORAZEPAM 2 MG/ML IJ SOLN: 2.0000 mg | Freq: Two times a day (BID) | INTRAMUSCULAR | Status: DC

## 2022-10-29 MED ORDER — ENSURE ENLIVE PO LIQD
237.0000 mL | Freq: Three times a day (TID) | ORAL | Status: DC
  Administered 2022-10-29 – 2022-10-30 (×6): 237 mL via ORAL

## 2022-10-29 MED ORDER — POTASSIUM PHOSPHATES 15 MMOLE/5ML IV SOLN
30.0000 mmol | Freq: Once | INTRAVENOUS | Status: AC
  Administered 2022-10-29: 30 mmol via INTRAVENOUS
  Filled 2022-10-29: qty 10

## 2022-10-29 NOTE — Plan of Care (Signed)

## 2022-10-29 NOTE — Progress Notes (Signed)
Initial Nutrition Assessment  DOCUMENTATION CODES:   Not applicable  INTERVENTION:   -Ensure Enlive po TID, each supplement provides 350 kcal and 20 grams of protein.  -MVI with minerals daily  NUTRITION DIAGNOSIS:   Inadequate oral intake related to poor appetite as evidenced by per patient/family report, meal completion < 25%.  GOAL:   Patient will meet greater than or equal to 90% of their needs  MONITOR:   PO intake, Supplement acceptance  REASON FOR ASSESSMENT:   Malnutrition Screening Tool    ASSESSMENT:   Pt with medical history significant of severe alcoholism for 20 years. Pt admitted with alcohol intoxication.  Pt admitted with alcohol intoxication.   Reviewed I/O's: -370 ml x 24 hours  UOP: 600 ml x 24 hours   Spoke with pt and mother at bedside. Pt reports decreased oral intake over the past month, reporting he does not feel likes eating. Per mom, pt eats one meal per day at best. Pt lives with his girlfriend, who is very supportive, and provides him with whatever he wants to eat, however, pt still eats very little. Pt shares that he typically grazes throughout the day (typical foods include bananas), but often does not feel hungry. He admits to this RD that alcohol often "fills me up" and prevent him from eating. Pt shares he usually consumes 2 fifths of liquor daily.   Observed breakfast tray; pt consumed only a few bites of bacon and a fruit cup. Pt reports drinking more liquids than eating solid foods. He has been consuming water and gingerale this morning.   Per pt, his UBW is around 155#. Pt shares that he has always been small framed, but has noticed muscle atrophy in his legs. Mom reports that she has also noticed that pt has lost weight progressively over the past few years. No wt loss noted per chart.   Discussed importance of good meal and supplement intake to promote healing. Pt amenable to supplements, stating he consumed a chocolate Ensure earlier  and liked it. RD also provided pt with gingerale and Ensure per his request.   Medications reviewed and include folic acid, ativan, thiamine, and dextrose 5%-0.9% sodium chloride infusion @ 125 ml/hr.   Labs reviewed: Na: 133, K: 3.2 (on IV supplementation), Phos: 1.8 (on IV supplementation).    NUTRITION - FOCUSED PHYSICAL EXAM:  Flowsheet Row Most Recent Value  Orbital Region No depletion  Upper Arm Region No depletion  Thoracic and Lumbar Region No depletion  Buccal Region No depletion  Temple Region No depletion  Clavicle Bone Region No depletion  Clavicle and Acromion Bone Region No depletion  Scapular Bone Region No depletion  Dorsal Hand No depletion  Patellar Region Moderate depletion  Anterior Thigh Region Moderate depletion  Posterior Calf Region Moderate depletion  Edema (RD Assessment) None  Hair Reviewed  Eyes Reviewed  Mouth Reviewed  Skin Reviewed  Nails Reviewed       Diet Order:   Diet Order             Diet regular Room service appropriate? Yes; Fluid consistency: Thin  Diet effective now                   EDUCATION NEEDS:   Education needs have been addressed  Skin:  Skin Assessment: Reviewed RN Assessment  Last BM:     Height:   Ht Readings from Last 1 Encounters:  10/28/22 5\' 11"  (1.803 m)    Weight:   Wt  Readings from Last 1 Encounters:  10/28/22 70.3 kg    Ideal Body Weight:  78.2 kg  BMI:  Body mass index is 21.62 kg/m.  Estimated Nutritional Needs:   Kcal:  2100-2300  Protein:  105-120 grams  Fluid:  > 2 L    Loistine Chance, RD, LDN, [place] Registered Dietitian II Certified Diabetes Care and Education Specialist Please refer to [facility] for RD and/or RD on-call/weekend/after hours pager

## 2022-10-29 NOTE — Progress Notes (Signed)
[facility] Progress Note  Patient: Billy Wood    [id number]  DOA: 10/28/2022     Date of Service: the patient was seen and examined on 10/29/2022  Chief Complaint  Patient presents with   Withdrawal   Brief hospital course: Fher Wyndham is a 42 y.o. male with medical history significant of severe alcoholism for 20 years.  He states he drinks 2 fifths of vodka per day.  He has not eaten anything in 3 days.  He vomits every morning.  Looking to stop drinking at this point.  In the ER found to have low potassium and low magnesium.  Hospitalist services were contacted for further evaluation.  The patient states that he has had seizures while trying to come off alcohol.    Assessment and Plan:  # Alcohol intoxication ([place]) Patient with alcohol level of 238.  Patient drinks 2 fifths per day.  High risk of withdrawal and delirium tremens.   Continue CIWA protocol. Continue thiamine, folate and multivitamin Due to alcohol intoxication, discussed with neurology, Ativan tapering dose Started Ativan 2 mg IV every 6 hours for 1 day, every 8 hourly for 1 day, every 12 hourly for 1 day, daily for 1 day Watch for any seizures and withdrawal symptoms Pantoprazole  40 mg p.o. twice daily for GI prophylaxis   # Alcoholic ketoacidosis IV fluid hydration with D5.  IV thiamine first.   Anion gap 19--5. resolved   # Essential hypertension Will place on clonidine for right now which may also help out with alcohol withdrawal   # Hyponatremia due to Etoh, monitor # Hypomagnesemia S/p 4 g IV magnesium.  Recheck level tomorrow. # Hypokalemia, Repleted # Hypophosphatemia, Phos repleted. Monitor electrolytes and replete as needed.    # Thrombocytopenia ([place]) Chronic in nature, likely secondary to 20 years of alcohol abuse.  hepatitis C NR Plts 90--62    # Elevated liver function tests Secondary to alcohol abuse.  INR wnl US Liver: Increased hepatic parenchymal echogenicity likely reflecting  fatty infiltration. No definite focal lesions or acute finding.   Elevated lipase Likely reactive.  No abdominal pain.  Body mass index is 21.62 kg/m.  Interventions:       Diet: Regular diet DVT Prophylaxis: SCD, pharmacological prophylaxis contraindicated due to thrombocytopenia    Advance goals of care discussion: Full code  Family Communication: family was present at bedside, at the time of interview.  The pt provided permission to discuss medical plan with the family. Opportunity was given to ask question and all questions were answered satisfactorily.   Disposition:  Pt is from Home, admitted with Etoh abuse and sezures, still has withdrawal symptoms, which precludes a safe discharge. Discharge to Home, when clinically stable.  Subjective: No significant events overnight, patient is feeling withdrawal symptoms, denies any chest pain or palpitation, no shortness of breath, Patient's mother was concerned regarding seizures, asking for seizure medication.   Physical Exam: General: NAD, lying comfortably Appear in no distress, affect appropriate Eyes: PERRLA ENT: Oral Mucosa Clear, moist  Neck: no JVD,  Cardiovascular: S1 and S2 Present, no Murmur,  Respiratory: good respiratory effort, Bilateral Air entry equal and Decreased, no Crackles, no wheezes Abdomen: Bowel Sound present, Soft and no tenderness,  Skin: no rashes Extremities: no Pedal edema, no calf tenderness Neurologic: Tremors of bilateral hand, no any focal deficit. Gait not checked due to patient safety concerns  Vitals:   10/29/22 0525 10/29/22 0759 10/29/22 0948 10/29/22 1223  BP: 126/86 (!) 139/92 Marland Kitchen)  144/93 128/89  Pulse: (!) 58 86 72 79  Resp: 18 16    Temp: 98 F (36.7 C) 98.7 F (37.1 C)    TempSrc:      SpO2: 93% 97%    Weight:      Height:        Intake/Output Summary (Last 24 hours) at 10/29/2022 1401 Last data filed at 10/29/2022 0600 Gross per 24 hour  Intake 230.23 ml  Output 600 ml   Net -369.77 ml   Filed Weights   10/28/22 1843  Weight: 70.3 kg    Data Reviewed: I have personally reviewed and interpreted daily labs, tele strips, imagings as discussed above. I reviewed all nursing notes, pharmacy notes, vitals, pertinent old records I have discussed plan of care as described above with RN and patient/family.  CBC: Recent Labs  Lab 10/28/22 1329 10/29/22 0659  WBC 3.6* 3.2*  HGB 16.4 14.2  HCT 44.3 39.8  MCV 87.4 90.9  PLT 90* 62*   Basic Metabolic Panel: Recent Labs  Lab 10/28/22 1329 10/29/22 0659  NA 138 133*  K 2.9* 3.2*  CL 96* 102  CO2 23 26  GLUCOSE 120* 94  BUN 7 <5*  CREATININE 0.76 0.61  CALCIUM 9.0 7.9*  MG 1.6* 1.9  PHOS  --  1.8*    Studies: US Abdomen Limited RUQ (LIVER/GB)  Result Date: 10/29/2022 CLINICAL DATA:  Alcohol abuse EXAM: ULTRASOUND ABDOMEN LIMITED RIGHT UPPER QUADRANT COMPARISON:  Right upper quadrant ultrasound 01/31/2020 FINDINGS: Gallbladder: No gallstones or wall thickening visualized. No sonographic Murphy sign noted by sonographer. Common bile duct: Diameter: 3 mm Liver: Parenchymal echogenicity is diffusely increased. There are no definite focal lesions. The liver surface contour appears smooth. Portal vein is patent on color Doppler imaging with normal direction of blood flow towards the liver. Other: None. IMPRESSION: Increased hepatic parenchymal echogenicity likely reflecting fatty infiltration. No definite focal lesions or acute finding. Electronically Signed   By: Valetta Mole M.D.   On: 10/29/2022 09:51    Scheduled Meds:  cloNIDine  0.1 mg Oral BID   enoxaparin (LOVENOX) injection  40 mg Subcutaneous Q24H   feeding supplement  237 mL Oral TID BM   folic acid  1 mg Oral Daily   LORazepam  0-4 mg Intravenous Q6H   Or   LORazepam  0-4 mg Oral Q6H   [START ON 10/31/2022] LORazepam  0-4 mg Intravenous Q12H   Or   [START ON 10/31/2022] LORazepam  0-4 mg Oral Q12H   multivitamin with minerals  1 tablet Oral  Daily   pantoprazole  40 mg Oral BID   thiamine  100 mg Oral Daily   Or   thiamine  100 mg Intravenous Daily   Continuous Infusions:  dextrose 5 % and 0.9% NaCl 1,000 mL (10/29/22 0516)   potassium PHOSPHATE IVPB (in mmol)     PRN Meds: acetaminophen **OR** acetaminophen, ondansetron **OR** ondansetron (ZOFRAN) IV  Time spent: 50 minutes  Author: Val Riles. MD [facility] 10/29/2022 2:01 PM  To reach On-call, see care teams to locate the attending and reach out to them via [email or web address]. If 7PM-7AM, please contact night-coverage If you still have difficulty reaching the attending provider, please page the [facility] (Director on Call) for [facility] on amion for assistance.

## 2022-10-30 DIAGNOSIS — F10920 Alcohol use, unspecified with intoxication, uncomplicated: Secondary | ICD-10-CM | POA: Diagnosis not present

## 2022-10-30 LAB — BASIC METABOLIC PANEL
Anion gap: 6 (ref 5–15)
BUN: <5 mg/dL — ABNORMAL LOW (ref 6–20)
CO2: 25 mmol/L (ref 22–32)
Calcium: 8.4 mg/dL — ABNORMAL LOW (ref 8.9–10.3)
Chloride: 100 mmol/L (ref 98–111)
Creatinine, Ser: 0.56 mg/dL — ABNORMAL LOW (ref 0.61–1.24)
GFR, Estimated: >60 mL/min (ref >60)
Glucose, Bld: 107 mg/dL — ABNORMAL HIGH (ref 70–99)
Potassium: 3.3 mmol/L — ABNORMAL LOW (ref 3.5–5.1)
Sodium: 131 mmol/L — ABNORMAL LOW (ref 135–145)

## 2022-10-30 LAB — CBC
HCT: 39.1 % (ref 39.0–52.0)
Hemoglobin: 14.4 g/dL (ref 13.0–17.0)
MCH: 32.4 pg (ref 26.0–34.0)
MCHC: 36.8 g/dL — ABNORMAL HIGH (ref 30.0–36.0)
MCV: 87.9 fL (ref 80.0–100.0)
Platelets: 52 10*3/uL — ABNORMAL LOW (ref 150–400)
RBC: 4.45 MIL/uL (ref 4.22–5.81)
RDW: 13 % (ref 11.5–15.5)
WBC: 3.9 10*3/uL — ABNORMAL LOW (ref 4.0–10.5)
nRBC: 0 % (ref 0.0–0.2)

## 2022-10-30 LAB — HEPATIC FUNCTION PANEL
ALT: 37 U/L (ref 0–44)
AST: 79 U/L — ABNORMAL HIGH (ref 15–41)
Albumin: 3.3 g/dL — ABNORMAL LOW (ref 3.5–5.0)
Alkaline Phosphatase: 67 U/L (ref 38–126)
Bilirubin, Direct: 0.3 mg/dL — ABNORMAL HIGH (ref 0.0–0.2)
Indirect Bilirubin: 0.9 mg/dL (ref 0.3–0.9)
Total Bilirubin: 1.2 mg/dL (ref 0.3–1.2)
Total Protein: 6.3 g/dL — ABNORMAL LOW (ref 6.5–8.1)

## 2022-10-30 LAB — MAGNESIUM: Magnesium: 1.5 mg/dL — ABNORMAL LOW (ref 1.7–2.4)

## 2022-10-30 LAB — GLUCOSE, CAPILLARY: Glucose-Capillary: 156 mg/dL — ABNORMAL HIGH (ref 70–99)

## 2022-10-30 LAB — LIPASE, BLOOD: Lipase: 69 U/L — ABNORMAL HIGH (ref 11–51)

## 2022-10-30 LAB — PHOSPHORUS: Phosphorus: 2.1 mg/dL — ABNORMAL LOW (ref 2.5–4.6)

## 2022-10-30 LAB — VITAMIN D 25 HYDROXY (VIT D DEFICIENCY, FRACTURES): Vit D, 25-Hydroxy: 42.97 ng/mL (ref 30–100)

## 2022-10-30 MED ORDER — MAGNESIUM SULFATE 4 GM/100ML IV SOLN
4.0000 g | Freq: Once | INTRAVENOUS | Status: AC
  Administered 2022-10-30: 4 g via INTRAVENOUS
  Filled 2022-10-30: qty 100

## 2022-10-30 MED ORDER — POTASSIUM PHOSPHATES 15 MMOLE/5ML IV SOLN
30.0000 mmol | Freq: Once | INTRAVENOUS | Status: AC
  Administered 2022-10-30: 30 mmol via INTRAVENOUS
  Filled 2022-10-30: qty 10

## 2022-10-30 NOTE — TOC Progression Note (Signed)
Transition of Care [facility]) - Progression Note    Patient Details  Name: Billy Wood MRN: [id number] Date of Birth: June 29, 1981  Transition of Care [facility]) CM/SW Contact  Gerilyn Pilgrim, LCSW Phone Number: 10/30/2022, 12:02 PM  Clinical Narrative:   Family requesting substance use resources. Resources added to AVS.          Expected Discharge Plan and Services                                               Social Determinants of Health (SDOH) Interventions SDOH Screenings   Food Insecurity: No Food Insecurity (10/29/2022)  Housing: Low Risk  (10/29/2022)  Transportation Needs: No Transportation Needs (10/29/2022)  Utilities: Not At Risk (10/29/2022)  Tobacco Use: Low Risk  (10/28/2022)    Readmission Risk Interventions     No data to display

## 2022-10-30 NOTE — Progress Notes (Signed)
[facility] Progress Note  Patient: Billy Wood    [id number]  DOA: 10/28/2022     Date of Service: the patient was seen and examined on 10/30/2022  Chief Complaint  Patient presents with   Withdrawal   Brief hospital course: Billy Wood is a 42 y.o. male with medical history significant of severe alcoholism for 20 years.  He states he drinks 2 fifths of vodka per day.  He has not eaten anything in 3 days.  He vomits every morning.  Looking to stop drinking at this point.  In the ER found to have low potassium and low magnesium.  Hospitalist services were contacted for further evaluation.  The patient states that he has had seizures while trying to come off alcohol.    Assessment and Plan:  # Alcohol intoxication  Patient with alcohol level of 238.  Patient drinks 2 fifths per day.  High risk of withdrawal and delirium tremens.   Continue CIWA protocol. Continue thiamine, folate and multivitamin Due to alcohol intoxication, discussed with neurology, Ativan tapering dose Started Ativan 2 mg IV every 6 hours for 1 day, every 8 hourly for 1 day, every 12 hourly for 1 day, daily for 1 day Watch for any seizures and withdrawal symptoms Pantoprazole  40 mg p.o. twice daily for GI prophylaxis   # Alcoholic ketoacidosis IV fluid hydration with D5.  IV thiamine first.   Anion gap 19--6 resolved   # Essential hypertension Started clonidine which may also help out with alcohol withdrawal   # Hyponatremia due to Etoh, monitor # Hypomagnesemia S/p 4 g IV magnesium.  Recheck level tomorrow. # Hypokalemia, Repleted # Hypophosphatemia, Phos repleted. Monitor electrolytes and replete as needed.    # Thrombocytopenia ([place]) Chronic in nature, likely secondary to 20 years of alcohol abuse.  hepatitis C NR Plts 90--62--52 Avoid hep and Lovenox, SCDs for DVT ppx    # Elevated liver function tests Secondary to alcohol abuse.  INR wnl US Liver: Increased hepatic parenchymal  echogenicity likely reflecting fatty infiltration. No definite focal lesions or acute finding.   Elevated lipase Likely reactive.  No abdominal pain. Lipase 72--69  Body mass index is 21.62 kg/m.  Interventions:       Diet: Regular diet DVT Prophylaxis: SCD, pharmacological prophylaxis contraindicated due to thrombocytopenia    Advance goals of care discussion: Full code  Family Communication: family was present at bedside, at the time of interview.  The pt provided permission to discuss medical plan with the family. Opportunity was given to ask question and all questions were answered satisfactorily.   Disposition:  Pt is from Home, admitted with Etoh abuse and sezures, still on IV Ativan, which precludes a safe discharge. Discharge to Home, when clinically stable.  Subjective: No significant events overnight, patient is feeling better now, denies withdrawal symptoms, denies any chest pain or palpitation, no shortness of breath,   Physical Exam: General: NAD, lying comfortably Appear in no distress, affect appropriate Eyes: PERRLA ENT: Oral Mucosa Clear, moist  Neck: no JVD,  Cardiovascular: S1 and S2 Present, no Murmur,  Respiratory: good respiratory effort, Bilateral Air entry equal and Decreased, no Crackles, no wheezes Abdomen: Bowel Sound present, Soft and no tenderness,  Skin: no rashes Extremities: no Pedal edema, no calf tenderness Neurologic: Tremors of bilateral hand, no any focal deficit. Gait not checked due to patient safety concerns  Vitals:   10/29/22 1709 10/29/22 2030 10/30/22 0443 10/30/22 0700  BP: 121/86 (!) 134/95 (!) 124/90 115/78  Pulse: (!) 56 61 (!) 53 (!) 58  Resp:  18 18 18   Temp:  98.2 F (36.8 C) 97.8 F (36.6 C) (!) 97.5 F (36.4 C)  TempSrc:  Oral  Oral  SpO2:  100% 98% 98%  Weight:      Height:        Intake/Output Summary (Last 24 hours) at 10/30/2022 1415 Last data filed at 10/30/2022 0200 Gross per 24 hour  Intake 1208.15  ml  Output 600 ml  Net 608.15 ml   Filed Weights   10/28/22 1843  Weight: 70.3 kg    Data Reviewed: I have personally reviewed and interpreted daily labs, tele strips, imagings as discussed above. I reviewed all nursing notes, pharmacy notes, vitals, pertinent old records I have discussed plan of care as described above with RN and patient/family.  CBC: Recent Labs  Lab 10/28/22 1329 10/29/22 0659 10/30/22 0506  WBC 3.6* 3.2* 3.9*  HGB 16.4 14.2 14.4  HCT 44.3 39.8 39.1  MCV 87.4 90.9 87.9  PLT 90* 62* 52*   Basic Metabolic Panel: Recent Labs  Lab 10/28/22 1329 10/29/22 0659 10/30/22 0506  NA 138 133* 131*  K 2.9* 3.2* 3.3*  CL 96* 102 100  CO2 23 26 25   GLUCOSE 120* 94 107*  BUN 7 <5* <5*  CREATININE 0.76 0.61 0.56*  CALCIUM 9.0 7.9* 8.4*  MG 1.6* 1.9 1.5*  PHOS  --  1.8* 2.1*    Studies: No results found.  Scheduled Meds:  cloNIDine  0.1 mg Oral BID   feeding supplement  237 mL Oral TID BM   folic acid  1 mg Oral Daily   LORazepam  0-4 mg Intravenous Q6H   Or   LORazepam  0-4 mg Oral Q6H   [START ON 10/31/2022] LORazepam  0-4 mg Intravenous Q12H   Or   [START ON 10/31/2022] LORazepam  0-4 mg Oral Q12H   LORazepam  2 mg Intravenous Q8H   Followed by   Derrill Memo ON 10/31/2022] LORazepam  2 mg Intravenous Q12H   Followed by   Derrill Memo ON 11/01/2022] LORazepam  2 mg Intravenous Daily   multivitamin with minerals  1 tablet Oral Daily   pantoprazole  40 mg Oral BID   thiamine  100 mg Oral Daily   Or   thiamine  100 mg Intravenous Daily   Continuous Infusions:  dextrose 5 % and 0.9% NaCl 125 mL/hr at 10/30/22 [id number]   potassium PHOSPHATE IVPB (in mmol) 30 mmol (10/30/22 1158)   PRN Meds: acetaminophen **OR** acetaminophen, ondansetron **OR** ondansetron (ZOFRAN) IV  Time spent: 50 minutes  Author: Val Riles. MD [facility] 10/30/2022 2:15 PM  To reach On-call, see care teams to locate the attending and reach out to them via [email or web address]. If  7PM-7AM, please contact night-coverage If you still have difficulty reaching the attending provider, please page the [facility] (Director on Call) for [facility] on amion for assistance.

## 2022-10-31 DIAGNOSIS — E871 Hypo-osmolality and hyponatremia: Secondary | ICD-10-CM | POA: Insufficient documentation

## 2022-10-31 DIAGNOSIS — E43 Unspecified severe protein-calorie malnutrition: Secondary | ICD-10-CM | POA: Insufficient documentation

## 2022-10-31 LAB — HEPATIC FUNCTION PANEL
ALT: 48 U/L — ABNORMAL HIGH (ref 0–44)
AST: 103 U/L — ABNORMAL HIGH (ref 15–41)
Albumin: 3.7 g/dL (ref 3.5–5.0)
Alkaline Phosphatase: 73 U/L (ref 38–126)
Bilirubin, Direct: 0.2 mg/dL (ref 0.0–0.2)
Indirect Bilirubin: 0.9 mg/dL (ref 0.3–0.9)
Total Bilirubin: 1.1 mg/dL (ref 0.3–1.2)
Total Protein: 7 g/dL (ref 6.5–8.1)

## 2022-10-31 LAB — BASIC METABOLIC PANEL
Anion gap: 15 (ref 5–15)
BUN: 7 mg/dL (ref 6–20)
CO2: 27 mmol/L (ref 22–32)
Calcium: 9.7 mg/dL (ref 8.9–10.3)
Chloride: 95 mmol/L — ABNORMAL LOW (ref 98–111)
Creatinine, Ser: 0.62 mg/dL (ref 0.61–1.24)
GFR, Estimated: >60 mL/min (ref >60)
Glucose, Bld: 99 mg/dL (ref 70–99)
Potassium: 4 mmol/L (ref 3.5–5.1)
Sodium: 137 mmol/L (ref 135–145)

## 2022-10-31 LAB — CBC
HCT: 42.2 % (ref 39.0–52.0)
Hemoglobin: 15.7 g/dL (ref 13.0–17.0)
MCH: 32.8 pg (ref 26.0–34.0)
MCHC: 37.2 g/dL — ABNORMAL HIGH (ref 30.0–36.0)
MCV: 88.1 fL (ref 80.0–100.0)
Platelets: 65 10*3/uL — ABNORMAL LOW (ref 150–400)
RBC: 4.79 MIL/uL (ref 4.22–5.81)
RDW: 13.2 % (ref 11.5–15.5)
WBC: 4.9 10*3/uL (ref 4.0–10.5)
nRBC: 0 % (ref 0.0–0.2)

## 2022-10-31 LAB — MAGNESIUM: Magnesium: 1.9 mg/dL (ref 1.7–2.4)

## 2022-10-31 LAB — PHOSPHORUS: Phosphorus: 3.2 mg/dL (ref 2.5–4.6)

## 2022-10-31 MED ORDER — LORAZEPAM 1 MG PO TABS: 1.0000 mg | ORAL_TABLET | Freq: Four times a day (QID) | ORAL | 0 refills | Status: AC | PRN

## 2022-10-31 MED ORDER — CHLORDIAZEPOXIDE HCL 5 MG PO CAPS
10.0000 mg | ORAL_CAPSULE | Freq: Three times a day (TID) | ORAL | Status: DC
  Administered 2022-10-31: 10 mg via ORAL
  Filled 2022-10-31: qty 2

## 2022-10-31 MED ORDER — LORAZEPAM 1 MG PO TABS: 1.0000 mg | ORAL_TABLET | Freq: Four times a day (QID) | ORAL | Status: DC | PRN

## 2022-10-31 MED ORDER — PANTOPRAZOLE SODIUM 40 MG PO TBEC: 40.0000 mg | DELAYED_RELEASE_TABLET | Freq: Every day | ORAL | 0 refills | Status: DC

## 2022-10-31 MED ORDER — CHLORDIAZEPOXIDE HCL 10 MG PO CAPS: 10.0000 mg | ORAL_CAPSULE | Freq: Three times a day (TID) | ORAL | 0 refills | Status: AC

## 2022-10-31 NOTE — TOC Transition Note (Signed)
Transition of Care [facility]) - CM/SW Discharge Note   Patient Details  Name: Billy Wood MRN: [id number] Date of Birth: 05-Mar-1981  Transition of Care [facility]) CM/SW Contact:  Candie Chroman, LCSW Phone Number: 10/31/2022, 10:32 AM   Clinical Narrative:   Patient has orders to discharge home today. No further concerns. CSW signing off.  Final next level of care: Home/Self Care Barriers to Discharge: Barriers Resolved   Patient Goals and CMS Choice      Discharge Placement                      Patient and family notified of of transfer: 10/31/22  Discharge Plan and Services Additional resources added to the After Visit Summary for                                       Social Determinants of Health (SDOH) Interventions SDOH Screenings   Food Insecurity: No Food Insecurity (10/29/2022)  Housing: Low Risk  (10/29/2022)  Transportation Needs: No Transportation Needs (10/29/2022)  Utilities: Not At Risk (10/29/2022)  Tobacco Use: Low Risk  (10/28/2022)     Readmission Risk Interventions     No data to display

## 2022-10-31 NOTE — Discharge Summary (Signed)
Physician Discharge Summary   Patient: Billy Wood MRN: [id number] DOB: 12-Nov-1980  Admit date:     10/28/2022  Discharge date: 10/31/22  Discharge Physician: Sharen Hones   PCP: Latanya Maudlin, NP   Recommendations at discharge:   With PCP in 1 week. Avoid alcohol drinking. Check in with alcohol program.  Discharge Diagnoses: Principal Problem:   Alcohol intoxication ([place]) Active Problems:   Alcoholic ketoacidosis   Essential hypertension   Hypomagnesemia   Hypokalemia   Thrombocytopenia ([facility])   Elevated liver function tests   Elevated lipase   Protein-calorie malnutrition, severe ([facility])   Hyponatremia History of alcohol withdrawal seizure. Resolved Problems:   * No resolved hospital problems. * Hypokalemia. Hyponatremia. Hypophosphatemia. Hypomagnesemia. Hospital Course: Billy Wood is a 42 y.o. male with medical history significant of severe alcoholism for 20 years.  He states he drinks 2 fifths of vodka per day.  He has not eaten anything in 3 days.  He vomits every morning.  Looking to stop drinking at this point.  In the ER found to have low potassium and low magnesium.  Hospitalist services were contacted for further evaluation.  The patient states that he has had seizures while trying to come off alcohol.   Assessment and Plan:  # Alcohol intoxication  # Alcoholic ketoacidosis History of alcohol withdrawal seizure. Patient was In the hospital due to risk of alcohol withdrawal seizure.  He was treated with alcohol withdrawal protocol, last drink was almost 4 days ago, condition has improved.  At this point, medically stable to be discharged.  Will continue 3 days as needed Ativan as well as long-acting benzodiazepine. She will be followed with PCP as outpatient.   # Essential hypertension Blood pressure better, discontinue clonidine.  Follow-up with PCP in the near future to start blood pressure medicine if blood pressure still high after discharge.   #  Hyponatremia  # Hypomagnesemia # Hypokalemia, # Hypophosphatemia, Conditions are normalized.     # Thrombocytopenia ([place]) Due to alcohol drinking, follow-up with PCP to recheck CBC in 1 week.     # Elevated liver function tests Elevated lipase Due to alcohol drinking, does not meet criteria for pancreatitis with lipase of 72.  Follow-up with PCP as outpatient.  Severe protein calorie malnutrition. Patient BMI is still within normal limits, however, patient has severe muscle atrophy, this is secondary to malnutrition from drinking alcohol.  Alcohol cessation is strongly urged.      Consultants: None Procedures performed: None  Disposition: Home Diet recommendation:  Discharge Diet Orders (From admission, onward)     Start     Ordered   10/31/22 0000  Diet - low sodium heart healthy        10/31/22 0958           Cardiac diet DISCHARGE MEDICATION: Allergies as of 10/31/2022   Not on File      Medication List     TAKE these medications    chlordiazePOXIDE 10 MG capsule Commonly known as: LIBRIUM Take 1 capsule (10 mg total) by mouth 3 (three) times daily for 3 days.   LORazepam 1 MG tablet Commonly known as: ATIVAN Take 1 tablet (1 mg total) by mouth every 6 (six) hours as needed for up to 3 days for anxiety.   ondansetron 4 MG tablet Commonly known as: Zofran Take 1 tablet (4 mg total) by mouth daily as needed for nausea or vomiting.   pantoprazole 40 MG tablet Commonly known as: PROTONIX Take 1  tablet (40 mg total) by mouth daily for 14 days.        Follow-up Information     Latanya Maudlin, NP. Schedule an appointment as soon as possible for a visit .   Specialty: Family Medicine Contact information: [place] [id number] [phone]                Discharge Exam: Danley Danker Weights   10/28/22 1843  Weight: 70.3 kg   General exam: Appears calm and comfortable  Respiratory system: Clear to auscultation.  Respiratory effort normal. Cardiovascular system: S1 & S2 heard, RRR. No JVD, murmurs, rubs, gallops or clicks. No pedal edema. Gastrointestinal system: Abdomen is nondistended, soft and nontender. No organomegaly or masses felt. Normal bowel sounds heard. Central nervous system: Alert and oriented. No focal neurological deficits. Extremities: Sever muscle atrophy. Skin: No rashes, lesions or ulcers Psychiatry: Judgement and insight appear normal. Mood & affect appropriate.    Condition at discharge: good  The results of significant diagnostics from this hospitalization (including imaging, microbiology, ancillary and laboratory) are listed below for reference.   Imaging Studies: US Abdomen Limited RUQ (LIVER/GB)  Result Date: 10/29/2022 CLINICAL DATA:  Alcohol abuse EXAM: ULTRASOUND ABDOMEN LIMITED RIGHT UPPER QUADRANT COMPARISON:  Right upper quadrant ultrasound 01/31/2020 FINDINGS: Gallbladder: No gallstones or wall thickening visualized. No sonographic Murphy sign noted by sonographer. Common bile duct: Diameter: 3 mm Liver: Parenchymal echogenicity is diffusely increased. There are no definite focal lesions. The liver surface contour appears smooth. Portal vein is patent on color Doppler imaging with normal direction of blood flow towards the liver. Other: None. IMPRESSION: Increased hepatic parenchymal echogenicity likely reflecting fatty infiltration. No definite focal lesions or acute finding. Electronically Signed   By: Valetta Mole M.D.   On: 10/29/2022 09:51    Microbiology: Results for orders placed or performed during the hospital encounter of 04/08/21  Resp Panel by RT-PCR (Flu A&B, Covid) Nasopharyngeal Swab     Status: None   Collection Time: 04/08/21  9:17 PM   Specimen: Nasopharyngeal Swab; Nasopharyngeal(NP) swabs in vial transport medium  Result Value Ref Range Status   SARS Coronavirus 2 by RT PCR NEGATIVE NEGATIVE Final    Comment: (NOTE) SARS-CoV-2 target nucleic acids  are NOT DETECTED.  The SARS-CoV-2 RNA is generally detectable in upper respiratory specimens during the acute phase of infection. The lowest concentration of SARS-CoV-2 viral copies this assay can detect is 138 copies/mL. A negative result does not preclude SARS-Cov-2 infection and should not be used as the sole basis for treatment or other patient management decisions. A negative result may occur with  improper specimen collection/handling, submission of specimen other than nasopharyngeal swab, presence of viral mutation(s) within the areas targeted by this assay, and inadequate number of viral copies(<138 copies/mL). A negative result must be combined with clinical observations, patient history, and epidemiological information. The expected result is Negative.  Fact Sheet for Patients:  EntrepreneurPulse.com.au  Fact Sheet for Healthcare Providers:  IncredibleEmployment.be  This test is no t yet approved or cleared by the [place] FDA and  has been authorized for detection and/or diagnosis of SARS-CoV-2 by FDA under an Emergency Use Authorization (EUA). This EUA will remain  in effect (meaning this test can be used) for the duration of the COVID-19 declaration under Section 564(b)(1) of the Act, 21 U.S.C.section 360bbb-3(b)(1), unless the authorization is terminated  or revoked sooner.       Influenza A by PCR NEGATIVE NEGATIVE Final  Influenza B by PCR NEGATIVE NEGATIVE Final    Comment: (NOTE) The Xpert Xpress SARS-CoV-2/FLU/RSV plus assay is intended as an aid in the diagnosis of influenza from Nasopharyngeal swab specimens and should not be used as a sole basis for treatment. Nasal washings and aspirates are unacceptable for Xpert Xpress SARS-CoV-2/FLU/RSV testing.  Fact Sheet for Patients: EntrepreneurPulse.com.au  Fact Sheet for Healthcare Providers: IncredibleEmployment.be  This test is  not yet approved or cleared by the [place] FDA and has been authorized for detection and/or diagnosis of SARS-CoV-2 by FDA under an Emergency Use Authorization (EUA). This EUA will remain in effect (meaning this test can be used) for the duration of the COVID-19 declaration under Section 564(b)(1) of the Act, 21 U.S.C. section 360bbb-3(b)(1), unless the authorization is terminated or revoked.  Performed at [facility], [address] [place],  [address]     Labs: CBC: Recent Labs  Lab 10/28/22 1329 10/29/22 0659 10/30/22 0506 10/31/22 0631  WBC 3.6* 3.2* 3.9* 4.9  HGB 16.4 14.2 14.4 15.7  HCT 44.3 39.8 39.1 42.2  MCV 87.4 90.9 87.9 88.1  PLT 90* 62* 52* 65*   Basic Metabolic Panel: Recent Labs  Lab 10/28/22 1329 10/29/22 0659 10/30/22 0506 10/31/22 0631  NA 138 133* 131* 137  K 2.9* 3.2* 3.3* 4.0  CL 96* 102 100 95*  CO2 23 26 25 27   GLUCOSE 120* 94 107* 99  BUN 7 <5* <5* 7  CREATININE 0.76 0.61 0.56* 0.62  CALCIUM 9.0 7.9* 8.4* 9.7  MG 1.6* 1.9 1.5* 1.9  PHOS  --  1.8* 2.1* 3.2   Liver Function Tests: Recent Labs  Lab 10/28/22 1329 10/29/22 0659 10/30/22 0506 10/31/22 0631  AST 160* 114* 79* 103*  ALT 57* 43 37 48*  ALKPHOS 88 67 67 73  BILITOT 1.3* 1.8* 1.2 1.1  PROT 7.9 6.4* 6.3* 7.0  ALBUMIN 4.3 3.4* 3.3* 3.7   CBG: Recent Labs  Lab 10/29/22 2028 10/30/22 2331  GLUCAP 90 156*    Discharge time spent: greater than 30 minutes.  Signed: Sharen Hones, MD [facility] 10/31/2022

## 2022-10-31 NOTE — Plan of Care (Signed)

## 2022-10-31 NOTE — Progress Notes (Signed)
Received MD order to discharge patient to home, I reviewed discharge instructions, home meds, prescriptions and follow up appointments with patient and patient verbalized understanding.

## 2022-11-12 ENCOUNTER — Ambulatory Visit (INDEPENDENT_AMBULATORY_CARE_PROVIDER_SITE_OTHER): Payer: BLUE CROSS/BLUE SHIELD

## 2022-11-12 ENCOUNTER — Ambulatory Visit
Admission: EM | Admit: 2022-11-12 | Discharge: 2022-11-12 | Disposition: A | Discharge status: Home / Self Care | Payer: BLUE CROSS/BLUE SHIELD | Attending: Family Medicine | Admitting: Family Medicine

## 2022-11-12 ENCOUNTER — Encounter: Payer: Self-pay | Admitting: Emergency Medicine

## 2022-11-12 DIAGNOSIS — F1010 Alcohol abuse, uncomplicated: Secondary | ICD-10-CM

## 2022-11-12 DIAGNOSIS — W19XXXA Unspecified fall, initial encounter: Secondary | ICD-10-CM

## 2022-11-12 MED ORDER — IBUPROFEN 800 MG PO TABS: 800.0000 mg | ORAL_TABLET | Freq: Three times a day (TID) | ORAL | 0 refills | Status: DC | PRN

## 2022-11-12 NOTE — ED Triage Notes (Signed)
Pt tripped and fell at home and landed on his coffee table last night. He c/o left side rib cage pain.

## 2022-11-12 NOTE — Discharge Instructions (Addendum)
He has minimal fracture of the anterior left ninth rib.  It is important to prevent pneumonia with rib fractures.  Be sure to take deep breaths several times an hour.  Speak with his primary care provider about naltrexone to help decrease cravings for alcohol.    Apply a Lidocaine patch for 12 hours then remove. Take ibuprofen as prescribed. Take 500-650 mg of Tylenol with the ibuprofen for additional pain relief.

## 2022-11-12 NOTE — ED Provider Notes (Signed)
[facility]    CSN: [id number] Arrival date & time: 11/12/22  1536      History   Chief Complaint Chief Complaint  Patient presents with   Fall    HPI  HPI Billy Wood is a 42 y.o. male.   Billy Wood presents with his girlfriend after a fall last evening.  Patient states that he was under the influence of alcohol and fell onto a wooden table.  He had direct impact to his left side.  This morning while at work he was unable to tolerate the pain so he came to the urgent care.  He denies hearing any abnormal pops or sounds.  Movement makes his pain worse.  Denies any swelling or bruising to the side.  Wants to be sure he did not break anything.      Past Medical History:  Diagnosis Date   Alcohol abuse    Substance abuse     Patient Active Problem List   Diagnosis Date Noted   Protein-calorie malnutrition, severe 10/31/2022   Hyponatremia 10/31/2022   Alcohol intoxication 10/28/2022   Essential hypertension 10/28/2022   Hypomagnesemia 10/28/2022   Hypokalemia 10/28/2022   Elevated liver function tests 99991111   Alcoholic ketoacidosis 99991111   Elevated lipase 10/28/2022   Alcohol withdrawal 04/09/2021   Alcohol use disorder, severe, dependence    Thrombocytopenia    Alcohol withdrawal seizure 04/08/2021    Past Surgical History:  Procedure Laterality Date   NO PAST SURGERIES         Home Medications    Prior to Admission medications   Medication Sig Start Date End Date Taking? Authorizing Provider  ibuprofen (ADVIL) 800 MG tablet Take 1 tablet (800 mg total) by mouth every 8 (eight) hours as needed. Take with food 11/12/22  Yes Marley Charlot, DO  ondansetron (ZOFRAN) 4 MG tablet Take 1 tablet (4 mg total) by mouth daily as needed for nausea or vomiting. 10/28/22 10/28/23  Lucillie Garfinkel, MD  pantoprazole (PROTONIX) 40 MG tablet Take 1 tablet (40 mg total) by mouth daily for 14 days. 10/31/22 11/14/22  Sharen Hones, MD    Family  History Family History  Problem Relation Age of Onset   Alcohol abuse Mother    Alcohol abuse Father     Social History Social History   Tobacco Use   Smoking status: Never   Smokeless tobacco: Never  Vaping Use   Vaping Use: Never used  Substance Use Topics   Alcohol use: Yes    Alcohol/week: 126.0 - 168.0 standard drinks of alcohol    Types: 84 Cans of beer, 42 - 84 Shots of liquor per week    Comment: occasional    Drug use: No     Allergies   Patient has no known allergies.   Review of Systems Review of Systems: :negative unless otherwise stated in HPI.      Physical Exam Triage Vital Signs ED Triage Vitals  Enc Vitals Group     BP      Pulse      Resp      Temp      Temp src      SpO2      Weight      Height      Head Circumference      Peak Flow      Pain Score      Pain Loc      Pain Edu?      Excl. in [place]?  No data found.  Updated Vital Signs BP 125/83 (BP Location: Right Arm)   Pulse 85   Temp 98.3 F (36.8 C) (Oral)   Resp 16   SpO2 94%   Visual Acuity Right Eye Distance:   Left Eye Distance:   Bilateral Distance:    Right Eye Near:   Left Eye Near:    Bilateral Near:     Physical Exam GEN: uncomfortable appearing male CV: regular rate and rhythm, no murmurs appreciated  RESP: no increased work of breathing, clear to ascultation bilaterally CHEST WALL: No erythema, step-offs or deformity, greenish discoloration to the left lateral chest wall possibly early onset bruising ABD: Bowel sounds present. Soft, non-tender, non-distended.  MSK: Left knee abrasion without edema creased range of motion SKIN: warm, dry, no rash on visible skin NEURO: alert, moves all extremities appropriately PSYCH: Normal affect, appropriate speech and behavior     UC Treatments / Results  Labs (all labs ordered are listed, but only abnormal results are displayed) Labs Reviewed - No data to display  EKG   Radiology DG Ribs Unilateral W/Chest  Left  Result Date: 11/12/2022 CLINICAL DATA:  [place] with trauma to the left side of the chest. EXAM: LEFT RIBS AND CHEST - 3+ VIEW COMPARISON:  None Available. FINDINGS: Heart and mediastinal shadows are normal. The lungs are clear. No pneumothorax or hemothorax. Left-sided rib films show a questionable minimal fracture of the anterior left ninth rib. IMPRESSION: 1. No active cardiopulmonary disease. No pneumothorax or hemothorax. 2. Question minimal fracture of the anterior left ninth rib. Electronically Signed   By: Nelson Chimes M.D.   On: 11/12/2022 16:19     Procedures Procedures (including critical care time)  Medications Ordered in UC Medications - No data to display  Initial Impression / Assessment and Plan / UC Course  I have reviewed the triage vital signs and the nursing notes.  Pertinent labs & imaging results that were available during my care of the patient were reviewed by me and considered in my medical decision making (see chart for details).      Pt is a 42 y.o.  male with acute onset of left rib pain after a fall yesterday while intoxicated.  Patient with significant history of alcohol abuse.  States that he drinks a sleeve (12) bottles) of alcohol a day.  He has tried cutting back and medication in the past but he is unsure if he could drink with that medication so he did not take it.  On chart review, he has had multiple admissions for alcohol withdrawal including seizures.  He was recently admitted and discharged with Librium and lorazepam.  States he did not pick up this medication. It appears he was prescribed naltrexone before but he was unsure if he could drink with this medication.  Discussed mechanism of naltrexone and he will speak with his primary care provider if he would like to start this medication.  Obtained chest x-ray with left rib series plain films.  Personally interpreted by me were unremarkable for fracture or dislocation however radiologist report reviewed  and  notes questionable anterior ninth rib fracture on the left.  X-ray results reviewed with patient and his girlfriend.  All questions asked were answered.  Fortunately, we do not have an incentive spirometer here.  Patient advised to take multiple deep breaths several times an hour.  Patient to gradually return to normal activities, as tolerated and continue ordinary activities within the limits permitted by pain. Prescribed ibuprofen  800 mg 3 times daily with food.  Recommended lidocaine patches for additional pain relief.  Add low-dose Tylenol if needed.  Advised patient to avoid OTC NSAIDs while taking prescription NSAID. Counseled patient on red flag symptoms and when to seek immediate care.  Return and ED precautions given. Understanding voiced. Discussed MDM, treatment plan and plan for follow-up with patient/parent who agrees with plan.   Final Clinical Impressions(s) / UC Diagnoses   Final diagnoses:  Fall, initial encounter  Alcohol abuse     Discharge Instructions      He has minimal fracture of the anterior left ninth rib.  It is important to prevent pneumonia with rib fractures.  Be sure to take deep breaths several times an hour.  Speak with his primary care provider about naltrexone to help decrease cravings for alcohol.    Apply a Lidocaine patch for 12 hours then remove. Take ibuprofen as prescribed. Take 500-650 mg of Tylenol with the ibuprofen for additional pain relief.      ED Prescriptions     Medication Sig Dispense Auth. Provider   ibuprofen (ADVIL) 800 MG tablet Take 1 tablet (800 mg total) by mouth every 8 (eight) hours as needed. Take with food 21 tablet Jasir Rother, DO      PDMP not reviewed this encounter.   Lyndee Hensen, DO 11/12/22 2100

## 2022-12-09 ENCOUNTER — Other Ambulatory Visit: Payer: Self-pay

## 2022-12-09 ENCOUNTER — Inpatient Hospital Stay
Admission: EM | Admit: 2022-12-09 | Discharge: 2022-12-11 | DRG: 896 | Disposition: A | Discharge status: Home / Self Care | Payer: BLUE CROSS/BLUE SHIELD | Attending: Internal Medicine | Admitting: Internal Medicine

## 2022-12-09 DIAGNOSIS — E43 Unspecified severe protein-calorie malnutrition: Secondary | ICD-10-CM | POA: Diagnosis present

## 2022-12-09 DIAGNOSIS — G928 Other toxic encephalopathy: Secondary | ICD-10-CM | POA: Diagnosis present

## 2022-12-09 DIAGNOSIS — E876 Hypokalemia: Secondary | ICD-10-CM | POA: Diagnosis present

## 2022-12-09 DIAGNOSIS — E8729 Other acidosis: Secondary | ICD-10-CM | POA: Diagnosis present

## 2022-12-09 DIAGNOSIS — F10939 Alcohol use, unspecified with withdrawal, unspecified: Secondary | ICD-10-CM | POA: Diagnosis not present

## 2022-12-09 DIAGNOSIS — K7040 Alcoholic hepatic failure without coma: Secondary | ICD-10-CM | POA: Diagnosis present

## 2022-12-09 DIAGNOSIS — Y904 Blood alcohol level of 80-99 mg/100 ml: Secondary | ICD-10-CM | POA: Diagnosis present

## 2022-12-09 DIAGNOSIS — F1020 Alcohol dependence, uncomplicated: Secondary | ICD-10-CM | POA: Diagnosis present

## 2022-12-09 DIAGNOSIS — Z811 Family history of alcohol abuse and dependence: Secondary | ICD-10-CM | POA: Diagnosis not present

## 2022-12-09 DIAGNOSIS — F10239 Alcohol dependence with withdrawal, unspecified: Principal | ICD-10-CM | POA: Diagnosis present

## 2022-12-09 DIAGNOSIS — R9431 Abnormal electrocardiogram [ECG] [EKG]: Secondary | ICD-10-CM | POA: Diagnosis present

## 2022-12-09 DIAGNOSIS — G9340 Encephalopathy, unspecified: Secondary | ICD-10-CM | POA: Diagnosis not present

## 2022-12-09 DIAGNOSIS — Z5941 Food insecurity: Secondary | ICD-10-CM

## 2022-12-09 DIAGNOSIS — E871 Hypo-osmolality and hyponatremia: Secondary | ICD-10-CM | POA: Diagnosis present

## 2022-12-09 DIAGNOSIS — F1410 Cocaine abuse, uncomplicated: Secondary | ICD-10-CM | POA: Diagnosis present

## 2022-12-09 DIAGNOSIS — Z681 Body mass index (BMI) 19 or less, adult: Secondary | ICD-10-CM | POA: Diagnosis not present

## 2022-12-09 DIAGNOSIS — I10 Essential (primary) hypertension: Secondary | ICD-10-CM | POA: Diagnosis present

## 2022-12-09 DIAGNOSIS — G929 Unspecified toxic encephalopathy: Secondary | ICD-10-CM | POA: Diagnosis present

## 2022-12-09 DIAGNOSIS — R4182 Altered mental status, unspecified: Secondary | ICD-10-CM | POA: Diagnosis present

## 2022-12-09 DIAGNOSIS — D696 Thrombocytopenia, unspecified: Secondary | ICD-10-CM | POA: Diagnosis present

## 2022-12-09 DIAGNOSIS — R569 Unspecified convulsions: Secondary | ICD-10-CM | POA: Diagnosis not present

## 2022-12-09 DIAGNOSIS — R7989 Other specified abnormal findings of blood chemistry: Secondary | ICD-10-CM | POA: Diagnosis present

## 2022-12-09 DIAGNOSIS — F10229 Alcohol dependence with intoxication, unspecified: Secondary | ICD-10-CM | POA: Diagnosis present

## 2022-12-09 DIAGNOSIS — Z515 Encounter for palliative care: Secondary | ICD-10-CM | POA: Diagnosis not present

## 2022-12-09 DIAGNOSIS — G4089 Other seizures: Secondary | ICD-10-CM | POA: Diagnosis present

## 2022-12-09 DIAGNOSIS — D649 Anemia, unspecified: Secondary | ICD-10-CM | POA: Diagnosis present

## 2022-12-09 DIAGNOSIS — F1910 Other psychoactive substance abuse, uncomplicated: Secondary | ICD-10-CM | POA: Diagnosis present

## 2022-12-09 HISTORY — DX: Unspecified toxic encephalopathy: G92.9

## 2022-12-09 LAB — LACTIC ACID, PLASMA: Lactic Acid, Venous: >9 mmol/L (ref 0.5–1.9)

## 2022-12-09 LAB — COMPREHENSIVE METABOLIC PANEL
ALT: 128 U/L — ABNORMAL HIGH (ref 0–44)
AST: 343 U/L — ABNORMAL HIGH (ref 15–41)
Albumin: 4 g/dL (ref 3.5–5.0)
Alkaline Phosphatase: 138 U/L — ABNORMAL HIGH (ref 38–126)
Anion gap: 26 — ABNORMAL HIGH (ref 5–15)
BUN: 8 mg/dL (ref 6–20)
CO2: 15 mmol/L — ABNORMAL LOW (ref 22–32)
Calcium: 8.6 mg/dL — ABNORMAL LOW (ref 8.9–10.3)
Chloride: 91 mmol/L — ABNORMAL LOW (ref 98–111)
Creatinine, Ser: 0.93 mg/dL (ref 0.61–1.24)
GFR, Estimated: >60 mL/min (ref >60)
Glucose, Bld: 158 mg/dL — ABNORMAL HIGH (ref 70–99)
Potassium: 2.4 mmol/L — CL (ref 3.5–5.1)
Sodium: 132 mmol/L — ABNORMAL LOW (ref 135–145)
Total Bilirubin: 2.2 mg/dL — ABNORMAL HIGH (ref 0.3–1.2)
Total Protein: 7.3 g/dL (ref 6.5–8.1)

## 2022-12-09 LAB — PROTIME-INR
INR: 1.3 — ABNORMAL HIGH (ref 0.8–1.2)
Prothrombin Time: 15.7 seconds — ABNORMAL HIGH (ref 11.4–15.2)

## 2022-12-09 LAB — SALICYLATE LEVEL: Salicylate Lvl: <7 mg/dL — ABNORMAL LOW (ref 7.0–30.0)

## 2022-12-09 LAB — CBC
HCT: 42 % (ref 39.0–52.0)
Hemoglobin: 15.4 g/dL (ref 13.0–17.0)
MCH: 34 pg (ref 26.0–34.0)
MCHC: 36.7 g/dL — ABNORMAL HIGH (ref 30.0–36.0)
MCV: 92.7 fL (ref 80.0–100.0)
Platelets: 101 10*3/uL — ABNORMAL LOW (ref 150–400)
RBC: 4.53 MIL/uL (ref 4.22–5.81)
RDW: 13.5 % (ref 11.5–15.5)
WBC: 8.4 10*3/uL (ref 4.0–10.5)
nRBC: 0 % (ref 0.0–0.2)

## 2022-12-09 LAB — PHOSPHORUS: Phosphorus: 1.8 mg/dL — ABNORMAL LOW (ref 2.5–4.6)

## 2022-12-09 LAB — MRSA NEXT GEN BY PCR, NASAL: MRSA by PCR Next Gen: NOT DETECTED

## 2022-12-09 LAB — ACETAMINOPHEN LEVEL: Acetaminophen (Tylenol), Serum: <10 ug/mL — ABNORMAL LOW (ref 10–30)

## 2022-12-09 LAB — MAGNESIUM
Magnesium: 1.2 mg/dL — ABNORMAL LOW (ref 1.7–2.4)
Magnesium: 1.8 mg/dL (ref 1.7–2.4)

## 2022-12-09 LAB — ETHANOL: Alcohol, Ethyl (B): 90 mg/dL — ABNORMAL HIGH (ref <10)

## 2022-12-09 LAB — GLUCOSE, CAPILLARY: Glucose-Capillary: 99 mg/dL (ref 70–99)

## 2022-12-09 MED ORDER — PHENOBARBITAL SODIUM 130 MG/ML IJ SOLN
97.5000 mg | Freq: Three times a day (TID) | INTRAMUSCULAR | Status: DC
  Administered 2022-12-09 – 2022-12-10 (×2): 97.5 mg via INTRAVENOUS
  Filled 2022-12-09 (×2): qty 1

## 2022-12-09 MED ORDER — LEVETIRACETAM IN NACL 1000 MG/100ML IV SOLN
1000.0000 mg | Freq: Once | INTRAVENOUS | Status: AC
  Administered 2022-12-09: 1000 mg via INTRAVENOUS
  Filled 2022-12-09: qty 100

## 2022-12-09 MED ORDER — PHENOBARBITAL SODIUM 65 MG/ML IJ SOLN: 65.0000 mg | Freq: Three times a day (TID) | INTRAMUSCULAR | Status: DC

## 2022-12-09 MED ORDER — METHYLPREDNISOLONE SODIUM SUCC 40 MG IJ SOLR
40.0000 mg | INTRAMUSCULAR | Status: DC
  Administered 2022-12-09 – 2022-12-10 (×2): 40 mg via INTRAVENOUS
  Filled 2022-12-09 (×2): qty 1

## 2022-12-09 MED ORDER — POLYETHYLENE GLYCOL 3350 17 G PO PACK: 17.0000 g | PACK | Freq: Every day | ORAL | Status: DC | PRN

## 2022-12-09 MED ORDER — THIAMINE HCL 100 MG/ML IJ SOLN
500.0000 mg | INTRAVENOUS | Status: DC
  Administered 2022-12-09 – 2022-12-10 (×2): 500 mg via INTRAVENOUS
  Filled 2022-12-09 (×3): qty 5

## 2022-12-09 MED ORDER — LORAZEPAM 2 MG/ML IJ SOLN
INTRAMUSCULAR | Status: AC
  Filled 2022-12-09: qty 1

## 2022-12-09 MED ORDER — LORAZEPAM 2 MG/ML IJ SOLN: 1.0000 mg | INTRAMUSCULAR | Status: DC | PRN

## 2022-12-09 MED ORDER — LORAZEPAM 2 MG PO TABS
0.0000 mg | ORAL_TABLET | ORAL | Status: DC
  Administered 2022-12-10 (×2): 2 mg via ORAL
  Administered 2022-12-10: 1 mg via ORAL
  Administered 2022-12-10 (×2): 2 mg via ORAL
  Administered 2022-12-11 (×2): 1 mg via ORAL
  Filled 2022-12-09 (×2): qty 1
  Filled 2022-12-09: qty 2
  Filled 2022-12-09 (×3): qty 1

## 2022-12-09 MED ORDER — ONDANSETRON HCL 4 MG/2ML IJ SOLN: 4.0000 mg | Freq: Four times a day (QID) | INTRAMUSCULAR | Status: DC | PRN

## 2022-12-09 MED ORDER — STERILE WATER FOR INJECTION IV SOLN
INTRAVENOUS | Status: DC
  Filled 2022-12-09: qty 1000
  Filled 2022-12-09: qty 150

## 2022-12-09 MED ORDER — ADULT MULTIVITAMIN W/MINERALS CH
1.0000 | ORAL_TABLET | Freq: Every day | ORAL | Status: DC
  Administered 2022-12-10 – 2022-12-11 (×2): 1 via ORAL
  Filled 2022-12-09 (×2): qty 1

## 2022-12-09 MED ORDER — PANTOPRAZOLE SODIUM 40 MG IV SOLR
40.0000 mg | INTRAVENOUS | Status: DC
  Administered 2022-12-09 – 2022-12-11 (×3): 40 mg via INTRAVENOUS
  Filled 2022-12-09 (×3): qty 10

## 2022-12-09 MED ORDER — LORAZEPAM 1 MG PO TABS
1.0000 mg | ORAL_TABLET | ORAL | Status: DC | PRN
  Filled 2022-12-09: qty 1

## 2022-12-09 MED ORDER — SODIUM CHLORIDE 0.9% FLUSH: 3.0000 mL | INTRAVENOUS | Status: DC | PRN

## 2022-12-09 MED ORDER — HEPARIN SODIUM (PORCINE) 5000 UNIT/ML IJ SOLN
5000.0000 [IU] | Freq: Three times a day (TID) | INTRAMUSCULAR | Status: DC
  Administered 2022-12-09 – 2022-12-11 (×6): 5000 [IU] via SUBCUTANEOUS
  Filled 2022-12-09 (×6): qty 1

## 2022-12-09 MED ORDER — MAGNESIUM SULFATE 2 GM/50ML IV SOLN
2.0000 g | Freq: Once | INTRAVENOUS | Status: AC
  Administered 2022-12-09: 2 g via INTRAVENOUS
  Filled 2022-12-09: qty 50

## 2022-12-09 MED ORDER — SODIUM CHLORIDE 0.9 % IV SOLN: 250.0000 mL | INTRAVENOUS | Status: DC | PRN

## 2022-12-09 MED ORDER — ACETAMINOPHEN 325 MG PO TABS: 650.0000 mg | ORAL_TABLET | ORAL | Status: DC | PRN

## 2022-12-09 MED ORDER — FOLIC ACID 5 MG/ML IJ SOLN
1.0000 mg | Freq: Every day | INTRAMUSCULAR | Status: DC
  Administered 2022-12-09 – 2022-12-11 (×3): 1 mg via INTRAVENOUS
  Filled 2022-12-09 (×3): qty 0.2

## 2022-12-09 MED ORDER — POTASSIUM CHLORIDE 10 MEQ/100ML IV SOLN
10.0000 meq | INTRAVENOUS | Status: AC
  Administered 2022-12-09 (×4): 10 meq via INTRAVENOUS
  Filled 2022-12-09 (×6): qty 100

## 2022-12-09 MED ORDER — LORAZEPAM 2 MG PO TABS: 0.0000 mg | ORAL_TABLET | Freq: Three times a day (TID) | ORAL | Status: DC

## 2022-12-09 MED ORDER — LEVETIRACETAM IN NACL 500 MG/100ML IV SOLN
500.0000 mg | INTRAVENOUS | Status: DC
  Administered 2022-12-09 – 2022-12-10 (×2): 500 mg via INTRAVENOUS
  Filled 2022-12-09 (×3): qty 100

## 2022-12-09 MED ORDER — PHENOBARBITAL SODIUM 65 MG/ML IJ SOLN: 32.5000 mg | Freq: Three times a day (TID) | INTRAMUSCULAR | Status: DC

## 2022-12-09 MED ORDER — DOCUSATE SODIUM 100 MG PO CAPS: 100.0000 mg | ORAL_CAPSULE | Freq: Two times a day (BID) | ORAL | Status: DC | PRN

## 2022-12-09 MED ORDER — LORAZEPAM 2 MG/ML IJ SOLN
2.0000 mg | Freq: Once | INTRAMUSCULAR | Status: AC
  Administered 2022-12-09: 2 mg via INTRAVENOUS

## 2022-12-09 MED ORDER — SODIUM CHLORIDE 0.9% FLUSH
3.0000 mL | Freq: Two times a day (BID) | INTRAVENOUS | Status: DC
  Administered 2022-12-09 – 2022-12-11 (×3): 3 mL via INTRAVENOUS

## 2022-12-09 MED ORDER — MIDAZOLAM HCL (PF) 5 MG/ML IJ SOLN
5.0000 mg | Freq: Once | INTRAMUSCULAR | Status: AC
  Administered 2022-12-09: 5 mg via INTRAMUSCULAR
  Filled 2022-12-09: qty 1

## 2022-12-09 MED ORDER — SODIUM CHLORIDE 0.9 % IV BOLUS
1000.0000 mL | Freq: Once | INTRAVENOUS | Status: AC
  Administered 2022-12-09: 1000 mL via INTRAVENOUS

## 2022-12-09 MED ORDER — SODIUM PHOSPHATES 45 MMOLE/15ML IV SOLN
30.0000 mmol | Freq: Once | INTRAVENOUS | Status: DC
  Filled 2022-12-09: qty 10

## 2022-12-09 MED ORDER — LACTATED RINGERS IV BOLUS
1000.0000 mL | Freq: Once | INTRAVENOUS | Status: AC
  Administered 2022-12-09: 1000 mL via INTRAVENOUS

## 2022-12-09 MED ORDER — SODIUM CHLORIDE 0.9 % IV SOLN
260.0000 mg | Freq: Once | INTRAVENOUS | Status: AC
  Administered 2022-12-09: 260 mg via INTRAVENOUS
  Filled 2022-12-09: qty 2

## 2022-12-09 NOTE — ED Notes (Signed)
Pt taken in Wheelchair to 18ha. This RN placed hand across  chest to hold pt due to his shakes and tremors. Akia EDT with this RN. Pushed pt thru triage door and pt began seizing. This RN called for help form MD and medications. Pt placed on 13ha bed on his side. Pt foaming from the mouth and taken into room 13. MD at bedside and other staff took care over.

## 2022-12-09 NOTE — H&P (Signed)
NAME:  Billy Wood, MRN:  [id number], DOB:  [birth year], LOS: 0 ADMISSION DATE:  12/09/2022  CHIEF COMPLAINT:  mental status changes  BRIEF SYNOPSIS  History of Present Illness:  42 y.o. male with past medical history of hypertension, alcohol abuse, and  seizures who presents to the ED for alcohol seizures/ETOH intoxication liver failure and acidosis  Patient initially arrived via private vehicle, stating his last drink was last night and that he was now having symptoms of withdrawal after drinking for 6 weeks straight.    He was being brought back to a room when he had a generalized tonic-clonic seizure lasting for about 1 minute.   Reports of last drink was last night and he had been having about 12 airplane bottles of liquor daily.    Patient also admitted to his wife that he used cocaine 1 week ago, but she is not aware of any other substance abuse.  Wife reports that patient has a history of alcohol withdrawal seizures, does not usually take seizure medication.   ER course Patient was given ativan, keppra, phenobarb for seizure    Significant Hospital Events: Including procedures, antibiotic start and stop dates in addition to other pertinent events   4/30 admit to ICU for ETOH abuse intoxication and metabolic encepahlopathy    Objective   Blood pressure (!) 133/93, pulse (!) 137, temperature 98.2 F (36.8 C), resp. rate 15, height 6' (1.829 m), weight 59 kg, SpO2 95 %.        Intake/Output Summary (Last 24 hours) at 12/09/2022 1431 Last data filed at 12/09/2022 1334 Gross per 24 hour  Intake 200 ml  Output --  Net 200 ml   Filed Weights   12/09/22 1215  Weight: 59 kg     REVIEW OF SYSTEMS  PATIENT IS UNABLE TO PROVIDE COMPLETE REVIEW OF SYSTEMS DUE TO SEVERE CRITICAL ILLNESS   PHYSICAL EXAMINATION:  GENERAL:critically ill appearing, thin and frail EYES: Pupils equal, round, reactive to light.  No scleral icterus.  MOUTH: Moist mucosal membrane.  NECK:  Supple.  PULMONARY: Lungs clear to auscultation CARDIOVASCULAR: S1 and S2.  Regular rate and rhythm GASTROINTESTINAL: Soft, nontender, -distended. Positive bowel sounds.  MUSCULOSKELETAL: No swelling, clubbing, or edema.  NEUROLOGIC: obtunded but arousable incoherent unable to assess NEURO exam Brain stem reflexes intactSKIN:normal, warm to touch, Capillary refill delayed  Pulses present bilaterally  Labs/imaging that I havepersonally reviewed  (right click and "Reselect all SmartList Selections" daily)      ASSESSMENT AND PLAN SYNOPSIS  42 y.o. male with past medical history of hypertension, alcohol abuse, and  seizures who presents to the ED for alcohol seizures/ETOH intoxication with liver damage and metabolic acidosis with severe metabolic encephalopathy  SEVERE ALCOHOL INTOXICATION -Therapy with Thiamine and MVI -CIWA Protocol -Precedex as needed -High risk for intubation  -high risk for aspiration  CARDIAC ICU monitoring   METABOLIC ACIDOSIS -continue Foley Catheter-assess need -Avoid nephrotoxic agents -Follow urine output, BMP -Ensure adequate renal perfusion, optimize oxygenation -Renal dose medications Start bicarb infusion for 12 hrs   Intake/Output Summary (Last 24 hours) at 12/09/2022 1431 Last data filed at 12/09/2022 1334 Gross per 24 hour  Intake 200 ml  Output --  Net 200 ml     NEUROLOGY Acute  metabolic encephalopathy Continue Keppra Watch for DT's Consider precedex if needed Consider NEURO consultation  ENDO - ICU hypoglycemic\Hyperglycemia protocol -check FSBS per protocol   GI GI PROPHYLAXIS as indicated  NUTRITIONAL STATUS DIET-->NPO Constipation protocol as indicated  ELECTROLYTES -follow labs as needed -replace as needed -pharmacy consultation and following   ACUTE ANEMIA- TRANSFUSE AS NEEDED CONSIDER TRANSFUSION  IF HGB<7   Elevated LFT's Check INR Consider liver failure   Best practice (right click and  "Reselect all SmartList Selections" daily)  Diet: NPO DVT prophylaxis: Subcutaneous Heparin GI prophylaxis: PPI Mobility:  bed rest  Code Status:  FULL Disposition:ICU  Labs   CBC: Recent Labs  Lab 12/09/22 1245  WBC 8.4  HGB 15.4  HCT 42.0  MCV 92.7  PLT 101*    Basic Metabolic Panel: Recent Labs  Lab 12/09/22 1245  NA 132*  K 2.4*  CL 91*  CO2 15*  GLUCOSE 158*  BUN 8  CREATININE 0.93  CALCIUM 8.6*  MG 1.2*   GFR: Estimated Creatinine Clearance: 85.5 mL/min (by C-G formula based on SCr of 0.93 mg/dL). Recent Labs  Lab 12/09/22 1245  WBC 8.4    Liver Function Tests: Recent Labs  Lab 12/09/22 1245  AST 343*  ALT 128*  ALKPHOS 138*  BILITOT 2.2*  PROT 7.3  ALBUMIN 4.0   No results for input(s): "LIPASE", "AMYLASE" in the last 168 hours. No results for input(s): "AMMONIA" in the last 168 hours.  ABG No results found for: "PHART", "PCO2ART", "PO2ART", "HCO3", "TCO2", "ACIDBASEDEF", "O2SAT"   Coagulation Profile: No results for input(s): "INR", "PROTIME" in the last 168 hours.  Cardiac Enzymes: No results for input(s): "CKTOTAL", "CKMB", "CKMBINDEX", "TROPONINI" in the last 168 hours.  HbA1C: No results found for: "HGBA1C"  CBG: No results for input(s): "GLUCAP" in the last 168 hours.   Past Medical History:  He,  has a past medical history of Alcohol abuse and Substance abuse (HCC).   Surgical History:   Past Surgical History:  Procedure Laterality Date   NO PAST SURGERIES       Social History:   reports that he has never smoked. He has never used smokeless tobacco. He reports current alcohol use of about 126.0 - 168.0 standard drinks of alcohol per week. He reports current drug use. Drug: Cocaine.   Family History:  His family history includes Alcohol abuse in his father and mother.   Allergies No Known Allergies   Home Medications  Prior to Admission medications   Medication Sig Start Date End Date Taking? Authorizing  Provider  ibuprofen (ADVIL) 800 MG tablet Take 1 tablet (800 mg total) by mouth every 8 (eight) hours as needed. Take with food 11/12/22   Brimage, Vondra, DO  ondansetron (ZOFRAN) 4 MG tablet Take 1 tablet (4 mg total) by mouth daily as needed for nausea or vomiting. 10/28/22 10/28/23  Pilar Jarvis, MD  pantoprazole (PROTONIX) 40 MG tablet Take 1 tablet (40 mg total) by mouth daily for 14 days. 10/31/22 11/14/22  Marrion Coy, MD       DVT/GI PRX  assessed I Assessed the need for Labs I Assessed the need for Foley I Assessed the need for Central Venous Line Family Discussion when available I Assessed the need for Mobilization I made an Assessment of medications to be adjusted accordingly Safety Risk assessment completed  CASE DISCUSSED IN MULTIDISCIPLINARY ROUNDS WITH ICU TEAM     Critical Care Time devoted to patient care services described in this note is 75 minutes.   Critical care was necessary to treat /prevent imminent and life-threatening deterioration.  Patient is critically ill.    Lucie Leather, M.D.  [facility]  Medical Director [facility] Medical Director  [facility] Cardio-Pulmonary Department

## 2022-12-09 NOTE — Progress Notes (Signed)
Responded to IV team consult - pt has 1 PIV provided 2 PIV's . Ultrasound to both arms - noted infiltrate Lt lower FA. If patient requires more lines Central line is recommended.

## 2022-12-09 NOTE — ED Notes (Signed)
ICU RN given report.

## 2022-12-09 NOTE — ED Triage Notes (Signed)
Pt to ED for ETOH withdrawals. Reports last etoh last night. Reports shaking and emesis. Family reports pt has been drinking for 6 weeks straight. States has not ate in one week.  Cocaine use 2 weeks ago

## 2022-12-09 NOTE — ED Provider Notes (Signed)
[facility] Provider Note    Event Date/Time   First MD Initiated Contact with Patient 12/09/22 1232     (approximate)   History   Chief Complaint ETOH withdrawals   HPI  Billy Wood is a 42 y.o. male with past medical history of hypertension, alcohol abuse, and alcohol withdrawal seizures who presents to the ED for alcohol withdrawal.  Patient initially arrived via private vehicle, stating his last drink was last night and that he was now having symptoms of withdrawal after drinking for 6 weeks straight.  He was being brought back to a room when he had a generalized tonic-clonic seizure lasting for about 1 minute.  Patient's wife states that his last drink was last night and he had been having about 12 airplane bottles of liquor daily.  Patient also admitted to his wife that he used cocaine 1 week ago, but she is not aware of any other substance abuse.  Wife reports that patient has a history of alcohol withdrawal seizures, does not usually take seizure medication.      Physical Exam   Triage Vital Signs: ED Triage Vitals  Enc Vitals Group     BP 12/09/22 1216 (!) 150/98     Pulse Rate 12/09/22 1216 (!) 117     Resp 12/09/22 1216 (!) 24     Temp 12/09/22 1216 98.2 F (36.8 C)     Temp src --      SpO2 12/09/22 1216 93 %     Weight 12/09/22 1215 130 lb (59 kg)     Height 12/09/22 1215 6' (1.829 m)     Head Circumference --      Peak Flow --      Pain Score 12/09/22 1214 0     Pain Loc --      Pain Edu? --      Excl. in GC? --     Most recent vital signs: Vitals:   12/09/22 1216 12/09/22 1253  BP: (!) 150/98 (!) 133/93  Pulse: (!) 117 (!) 137  Resp:  15  Temp:    SpO2:  95%    Constitutional: Somnolent, intermittently agitated. Eyes: Conjunctivae are normal.  Gaze midline, pupils equal, round, and reactive to light bilaterally. Head: Atraumatic. Nose: No congestion/rhinnorhea. Mouth/Throat: Mucous membranes are moist.   Cardiovascular: Tachycardic, regular rhythm. Grossly normal heart sounds.  2+ radial pulses bilaterally. Respiratory: Tachypneic with normal respiratory effort.  No retractions. Lungs CTAB. Gastrointestinal: Soft and nontender. No distention. Musculoskeletal: No lower extremity tenderness nor edema.  Neurologic:  No gross focal neurologic deficits are appreciated, moving all extremities equally.    ED Results / Procedures / Treatments   Labs (all labs ordered are listed, but only abnormal results are displayed) Labs Reviewed  COMPREHENSIVE METABOLIC PANEL - Abnormal; Notable for the following components:      Result Value   Sodium 132 (*)    Potassium 2.4 (*)    Chloride 91 (*)    CO2 15 (*)    Glucose, Bld 158 (*)    Calcium 8.6 (*)    AST 343 (*)    ALT 128 (*)    Alkaline Phosphatase 138 (*)    Total Bilirubin 2.2 (*)    Anion gap 26 (*)    All other components within normal limits  ETHANOL - Abnormal; Notable for the following components:   Alcohol, Ethyl (B) 90 (*)    All other components within normal limits  SALICYLATE LEVEL -  Abnormal; Notable for the following components:   Salicylate Lvl <7.0 (*)    All other components within normal limits  ACETAMINOPHEN LEVEL - Abnormal; Notable for the following components:   Acetaminophen (Tylenol), Serum <10 (*)    All other components within normal limits  CBC - Abnormal; Notable for the following components:   MCHC 36.7 (*)    Platelets 101 (*)    All other components within normal limits  MAGNESIUM - Abnormal; Notable for the following components:   Magnesium 1.2 (*)    All other components within normal limits  URINE DRUG SCREEN, QUALITATIVE ([facility] ONLY)  LACTIC ACID, PLASMA  PROTIME-INR  CBC  CREATININE, SERUM  PHOSPHORUS  MAGNESIUM     EKG  ED ECG REPORT I, Chesley Noon, the attending physician, personally viewed and interpreted this ECG.   Date: 12/09/2022  EKG Time: 12:47  Rate: 142  Rhythm: sinus  tachycardia  Axis: RAD  Intervals: Prolonged QT  ST&T Change: None  PROCEDURES:  Critical Care performed: Yes, see critical care procedure note(s)  .Critical Care  Performed by: Chesley Noon, MD Authorized by: Chesley Noon, MD   Critical care provider statement:    Critical care time (minutes):  30   Critical care time was exclusive of:  Separately billable procedures and treating other patients and teaching time   Critical care was necessary to treat or prevent imminent or life-threatening deterioration of the following conditions:  Metabolic crisis and CNS failure or compromise   Critical care was time spent personally by me on the following activities:  Development of treatment plan with patient or surrogate, discussions with consultants, evaluation of patient's response to treatment, examination of patient, ordering and review of laboratory studies, ordering and review of radiographic studies, ordering and performing treatments and interventions, pulse oximetry, re-evaluation of patient's condition and review of old charts   I assumed direction of critical care for this patient from another provider in my specialty: no     Care discussed with: admitting provider      MEDICATIONS ORDERED IN ED: Medications  PHENObarbital (LUMINAL) injection 97.5 mg (has no administration in time range)    Followed by  PHENObarbital (LUMINAL) injection 65 mg (has no administration in time range)    Followed by  PHENObarbital (LUMINAL) injection 32.5 mg (has no administration in time range)  potassium chloride 10 mEq in 100 mL IVPB (10 mEq Intravenous New Bag/Given 12/09/22 1440)  levETIRAcetam (KEPPRA) IVPB 500 mg/100 mL premix (has no administration in time range)  lactated ringers bolus 1,000 mL (has no administration in time range)  sodium chloride flush (NS) 0.9 % injection 3 mL (has no administration in time range)  sodium chloride flush (NS) 0.9 % injection 3 mL (has no administration  in time range)  0.9 %  sodium chloride infusion (has no administration in time range)  acetaminophen (TYLENOL) tablet 650 mg (has no administration in time range)  docusate sodium (COLACE) capsule 100 mg (has no administration in time range)  polyethylene glycol (MIRALAX / GLYCOLAX) packet 17 g (has no administration in time range)  ondansetron (ZOFRAN) injection 4 mg (has no administration in time range)  heparin injection 5,000 Units (has no administration in time range)  thiamine (VITAMIN B1) 500 mg in sodium chloride 0.9 % 50 mL IVPB (has no administration in time range)  pantoprazole (PROTONIX) injection 40 mg (has no administration in time range)  midazolam PF (VERSED) injection 5 mg (5 mg Intramuscular Given 12/09/22 1212)  LORazepam (  ATIVAN) injection 2 mg ( Intravenous Canceled Entry 12/09/22 1249)  PHENObarbital (LUMINAL) 260 mg in sodium chloride 0.9 % 100 mL IVPB (0 mg Intravenous Stopped 12/09/22 1334)  levETIRAcetam (KEPPRA) IVPB 1000 mg/100 mL premix (0 mg Intravenous Stopped 12/09/22 1313)  magnesium sulfate IVPB 2 g 50 mL (0 g Intravenous Stopped 12/09/22 1437)  sodium chloride 0.9 % bolus 1,000 mL (1,000 mLs Intravenous New Bag/Given 12/09/22 1437)     IMPRESSION / MDM / ASSESSMENT AND PLAN / ED COURSE  I reviewed the triage vital signs and the nursing notes.                              42 y.o. male with past medical history of hypertension, alcohol abuse, and withdrawal seizures who presents to the ED initially for alcohol withdrawal, subsequently had generalized seizure while being brought back to her room.  Patient's presentation is most consistent with acute presentation with potential threat to life or bodily function.  Differential diagnosis includes, but is not limited to, alcohol withdrawal seizure, electrolyte abnormality, seizure disorder, status epilepticus.  I was called back to the hallway with as patient was being wheeled to a room, noted to have generalized  tonic-clonic seizure in the wheelchair lasting for about 1 minute.  He was given 5 mg of IM Versed as seizure activity began to slow, IV access subsequently obtained and he was given 2 mg of IV Ativan.  Patient now somnolent but intermittently agitated, no focal neurologic deficits noted and he did not have any fall with a seizure.  Presentation consistent with alcohol withdrawal seizure, discussed phenobarbital protocol with pharmacy, who will place orders for this.  We will also give IV Keppra, lab results are pending at this time.  Labs show stable thrombocytopenia, no significant anemia or leukocytosis noted.  Patient does have severe hypokalemia and hypomagnesemia, likely contributing to prolonged QT seen on EKG.  He also has significant anion gap acidosis, suspect alcoholic ketoacidosis.  Transaminitis worse compared to previous, likely alcoholic hepatitis and low suspicion for biliary disease at this time.  Patient does appear to be settling down following IV phenobarbital, heart rate improving and he is sleeping comfortably in the room.  Case discussed with ICU team for admission due to need for close monitoring.      FINAL CLINICAL IMPRESSION(S) / ED DIAGNOSES   Final diagnoses:  Alcohol withdrawal syndrome with complication (HCC)  Alcohol withdrawal seizure with complication (HCC)  Hypomagnesemia  Hypokalemia  Alcoholic ketoacidosis     Rx / DC Orders   ED Discharge Orders     None        Note:  This document was prepared using Dragon voice recognition software and may include unintentional dictation errors.   Chesley Noon, MD 12/09/22 1447

## 2022-12-09 NOTE — Consult Note (Signed)
NEURO HOSPITALIST CONSULT NOTE   Requestig physician: Dr. Belia Heman  Reason for Consult: Seizure in the context of alcohol intoxication  History obtained from:  Patient and Chart     HPI:                                                                                                                                          Billy Wood is an 42 y.o. male with a history of EtOH withdrawal seizures, current heavy alcohol use consisting of 12 one ounce shots of vodka per day and substance abuse who presented to the ED today for assistance with detoxing from EtOH after he ran out of his liquor supply. He states that his last drink was last night and that he woke up with withdrawal symptoms, which include feeling shaky along with emesis. Family reported that the patient has been drinking for 6 weeks straight. He stated that he has not eaten in one week. He endorses cocaine use 2 weeks ago. He states that he has never had a seizure outside of alcohol withdrawal. Ethanol level was 90 in the ED.   While in the ED he had a GTC seizure with foaming at the mouth. He was given 5 mg of IM Versed as seizure activity began to slow, IV access subsequently obtained and he was given 2 mg of IV Ativan. Patient was then somnolent but intermittently agitated. EDP discussed phenobarbital protocol with pharmacy, who placed orders for this. We was also given IV Keppra. Labs showed severe hypokalemia and hypomagnesemia as well as significant anion gap acidosis suspected to be due to alcoholic ketoacidosis. His transaminases were elevated, worse compared to previous documented labs. He was then admitted to the ICU for further management.   Past Medical History:  Diagnosis Date   Alcohol abuse    Substance abuse (HCC)     Past Surgical History:  Procedure Laterality Date   NO PAST SURGERIES      Family History  Problem Relation Age of Onset   Alcohol abuse Mother    Alcohol abuse Father                Social History:  reports that he has never smoked. He has never used smokeless tobacco. He reports current alcohol use of about 126.0 - 168.0 standard drinks of alcohol per week. He reports current drug use. Drug: Cocaine.  No Known Allergies  MEDICATIONS:  Prior to Admission:  Medications Prior to Admission  Medication Sig Dispense Refill Last Dose   ibuprofen (ADVIL) 800 MG tablet Take 1 tablet (800 mg total) by mouth every 8 (eight) hours as needed. Take with food 21 tablet 0 prn at unk   pantoprazole (PROTONIX) 40 MG tablet Take 1 tablet (40 mg total) by mouth daily for 14 days. 14 tablet 0    Scheduled:  folic acid  1 mg Intravenous Daily   heparin  5,000 Units Subcutaneous Q8H   [START ON 12/10/2022] LORazepam  0-4 mg Oral Q4H   Followed by   Melene Muller ON 12/12/2022] LORazepam  0-4 mg Oral Q8H   methylPREDNISolone (SOLU-MEDROL) injection  40 mg Intravenous Q24H   [START ON 12/10/2022] multivitamin with minerals  1 tablet Oral Daily   pantoprazole (PROTONIX) IV  40 mg Intravenous Q24H   PHENObarbital  97.5 mg Intravenous Q8H   Followed by   [START ON 12/11/2022] PHENObarbital  65 mg Intravenous Q8H   Followed by   Melene Muller ON 12/13/2022] PHENObarbital  32.5 mg Intravenous Q8H   sodium chloride flush  3 mL Intravenous Q12H   Continuous:  sodium chloride     levETIRAcetam Stopped (12/09/22 1721)   sodium bicarbonate 150 mEq in sterile water 1,150 mL infusion 75 mL/hr at 12/09/22 1705   sodium phosphate 30 mmol in dextrose 5 % 250 mL infusion     thiamine (VITAMIN B1) injection Stopped (12/09/22 1734)     ROS:                                                                                                                                       The patient currently complains of mild EtOH withdrawal symptoms (11:00 PM).    Blood pressure (!) 134/92, pulse 85, temperature  98.2 F (36.8 C), resp. rate 20, height 6' (1.829 m), weight 59 kg, SpO2 100 %.   General Examination:                                                                                                       Physical Exam HEENT- [place]/AT   Lungs- Respirations unlabored Extremities- Warm and well-perfused  Neurological Examination Mental Status: Awake, alert and oriented. Affect dysthymic.  Speech fluent without evidence of aphasia.  Able to follow all commands without difficulty. Cranial Nerves: II: Temporal visual fields intact with no extinction to DSS. PERRL with mild enlargement noted. . III,IV, VI: No ptosis. EOMI. No nystagmus. V: Temp sensation  equal bilaterally VII: Smile symmetric VIII: Hearing intact to voice IX,X: No hypophonia or hoarseness XI: Symmetric XII: No lingual dysarthria Motor: RUE: 4+/5 RLE: 4+/5 LUE: 4+/5 LLE: 4+/5 Sensory: Temp and FT intact x 4 except for distal BLE (chronic neuropathy) Deep Tendon Reflexes: Hypoactive throughout Plantars: Right: downgoingLeft: downgoing Cerebellar: No ataxia with FNF bilaterally, but with significant tremor noted.  Gait: Deferred   Lab Results: Basic Metabolic Panel: Recent Labs  Lab 12/09/22 1245  NA 132*  K 2.4*  CL 91*  CO2 15*  GLUCOSE 158*  BUN 8  CREATININE 0.93  CALCIUM 8.6*  MG 1.2*    CBC: Recent Labs  Lab 12/09/22 1245  WBC 8.4  HGB 15.4  HCT 42.0  MCV 92.7  PLT 101*    Cardiac Enzymes: No results for input(s): "CKTOTAL", "CKMB", "CKMBINDEX", "TROPONINI" in the last 168 hours.  Lipid Panel: No results for input(s): "CHOL", "TRIG", "HDL", "CHOLHDL", "VLDL", "LDLCALC" in the last 168 hours.  Imaging: No results found.   Assessment: 42 year old male presenting with EtOH withdrawal seizure - Neurology exam is consistent with mild EtOH withdrawal symptoms in the context of being covered with phenobarbital  - No history of seizures unrelated to EtOH withdrawal   Recommendations: -  Can discontinue Keppra if EEG tomorrow is negative.  - Continue phenobarbital for now. Can taper off phenobarbital when out of the withdrawal time window.  - Substance abuse counseling for EtOH cessation. Will need a benzodiazepine taper to prevent seizure recurrence.  - Inpatient seizure precautions - EEG will be obtained given that his seizure occurred while EtOH level was 90, although if he is habituated to high levels of EtOH it is certainly possible that he could have been withdrawing despite some alcohol still having been present in his system.  - Ativan PRN seizures and EtOH withdrawal symptoms   Electronically signed: Dr. Caryl Pina 12/09/2022, 3:08 PM

## 2022-12-09 NOTE — Progress Notes (Signed)
PHARMACY CONSULT NOTE  Pharmacy Consult for Electrolyte Monitoring and Replacement   Recent Labs: Potassium (mmol/L)  Date Value  12/09/2022 2.4 (LL)   Magnesium (mg/dL)  Date Value  40/98/1191 1.8   Calcium (mg/dL)  Date Value  47/82/9562 8.6 (L)   Albumin (g/dL)  Date Value  13/03/6577 4.0   Phosphorus (mg/dL)  Date Value  46/96/2952 1.8 (L)   Sodium (mmol/L)  Date Value  12/09/2022 132 (L)     Assessment: 42 y.o. male with past medical history of hypertension, alcohol abuse, and  seizures who presents to the ED for alcohol seizures/ETOH intoxication liver failure and acidosis    Goal of Therapy:  Electrolytes WNL  Plan:  ---complete 2 remaining orders for 10 mEq IV Kcl ---2 grams IV magnesium sulfate x 1 ---30 mmol IV sodium phosphate x 1 ---recheck electrolytes in am  Lowella Bandy ,PharmD Clinical Pharmacist 12/09/2022 3:58 PM

## 2022-12-10 ENCOUNTER — Inpatient Hospital Stay: Payer: BLUE CROSS/BLUE SHIELD

## 2022-12-10 ENCOUNTER — Encounter: Payer: Self-pay | Admitting: Internal Medicine

## 2022-12-10 DIAGNOSIS — F10939 Alcohol use, unspecified with withdrawal, unspecified: Secondary | ICD-10-CM | POA: Diagnosis not present

## 2022-12-10 DIAGNOSIS — Z515 Encounter for palliative care: Secondary | ICD-10-CM

## 2022-12-10 DIAGNOSIS — R569 Unspecified convulsions: Secondary | ICD-10-CM | POA: Diagnosis not present

## 2022-12-10 LAB — CBC
HCT: 34.9 % — ABNORMAL LOW (ref 39.0–52.0)
Hemoglobin: 13.1 g/dL (ref 13.0–17.0)
MCH: 34.2 pg — ABNORMAL HIGH (ref 26.0–34.0)
MCHC: 37.5 g/dL — ABNORMAL HIGH (ref 30.0–36.0)
MCV: 91.1 fL (ref 80.0–100.0)
Platelets: 68 10*3/uL — ABNORMAL LOW (ref 150–400)
RBC: 3.83 MIL/uL — ABNORMAL LOW (ref 4.22–5.81)
RDW: 13.6 % (ref 11.5–15.5)
WBC: 5.5 10*3/uL (ref 4.0–10.5)
nRBC: 0 % (ref 0.0–0.2)

## 2022-12-10 LAB — MAGNESIUM: Magnesium: 1.9 mg/dL (ref 1.7–2.4)

## 2022-12-10 LAB — URINE DRUG SCREEN, QUALITATIVE (ARMC ONLY)
Amphetamines, Ur Screen: NOT DETECTED
Barbiturates, Ur Screen: POSITIVE — AB
Benzodiazepine, Ur Scrn: POSITIVE — AB
Cannabinoid 50 Ng, Ur ~~LOC~~: NOT DETECTED
Cocaine Metabolite,Ur ~~LOC~~: POSITIVE — AB
MDMA (Ecstasy)Ur Screen: NOT DETECTED
Methadone Scn, Ur: NOT DETECTED
Opiate, Ur Screen: NOT DETECTED
Phencyclidine (PCP) Ur S: NOT DETECTED
Tricyclic, Ur Screen: NOT DETECTED

## 2022-12-10 LAB — RENAL FUNCTION PANEL
Albumin: 3.2 g/dL — ABNORMAL LOW (ref 3.5–5.0)
Anion gap: 9 (ref 5–15)
BUN: 6 mg/dL (ref 6–20)
CO2: 29 mmol/L (ref 22–32)
Calcium: 7.8 mg/dL — ABNORMAL LOW (ref 8.9–10.3)
Chloride: 93 mmol/L — ABNORMAL LOW (ref 98–111)
Creatinine, Ser: 0.59 mg/dL — ABNORMAL LOW (ref 0.61–1.24)
GFR, Estimated: >60 mL/min (ref >60)
Glucose, Bld: 112 mg/dL — ABNORMAL HIGH (ref 70–99)
Phosphorus: 2.7 mg/dL (ref 2.5–4.6)
Potassium: 3.2 mmol/L — ABNORMAL LOW (ref 3.5–5.1)
Sodium: 131 mmol/L — ABNORMAL LOW (ref 135–145)

## 2022-12-10 LAB — LACTIC ACID, PLASMA: Lactic Acid, Venous: 1.3 mmol/L (ref 0.5–1.9)

## 2022-12-10 MED ORDER — POTASSIUM CHLORIDE CRYS ER 20 MEQ PO TBCR
40.0000 meq | EXTENDED_RELEASE_TABLET | Freq: Once | ORAL | Status: AC
  Administered 2022-12-10: 40 meq via ORAL
  Filled 2022-12-10: qty 2

## 2022-12-10 MED ORDER — HYDRALAZINE HCL 20 MG/ML IJ SOLN: 10.0000 mg | INTRAMUSCULAR | Status: DC | PRN

## 2022-12-10 MED ORDER — POTASSIUM CHLORIDE 10 MEQ/100ML IV SOLN
10.0000 meq | INTRAVENOUS | Status: AC
  Administered 2022-12-10: 10 meq via INTRAVENOUS
  Filled 2022-12-10 (×4): qty 100

## 2022-12-10 MED ORDER — LACTATED RINGERS IV SOLN: INTRAVENOUS | Status: DC

## 2022-12-10 MED ORDER — CHLORDIAZEPOXIDE HCL 25 MG PO CAPS
25.0000 mg | ORAL_CAPSULE | Freq: Three times a day (TID) | ORAL | Status: DC
  Administered 2022-12-10 – 2022-12-11 (×4): 25 mg via ORAL
  Filled 2022-12-10 (×4): qty 1

## 2022-12-10 MED ORDER — CHLORHEXIDINE GLUCONATE CLOTH 2 % EX PADS
6.0000 | MEDICATED_PAD | Freq: Every day | CUTANEOUS | Status: DC
  Administered 2022-12-10 – 2022-12-11 (×2): 6 via TOPICAL

## 2022-12-10 NOTE — Progress Notes (Addendum)
   CHIEF COMPLAINT:   COCAINE AND ETOH INTOXICATION   Subjective  Alert and awake Lethargic but arousable No complaints     Objective   Examination:  General exam: Appears calm and comfortable  Respiratory system: Clear to auscultation. Respiratory effort normal. HEENT: [place]/AT, PERRLA, no thrush, no stridor. Cardiovascular system: S1 & S2 heard, RRR. No JVD, murmurs, rubs, gallops or clicks. No pedal edema. Gastrointestinal system: Abdomen is nondistended, soft and nontender. No organomegaly or masses felt. Normal bowel sounds heard. Central nervous system: Alert and oriented. No focal neurological deficits. Extremities: Symmetric 5 x 5 power. Skin: No rashes, lesions or ulcers   VITALS:  height is 6' (1.829 m) and weight is 63.4 kg. His oral temperature is 98.6 F (37 C). His blood pressure is 147/81 (abnormal) and his pulse is 69. His respiration is 15 and oxygen saturation is 100%.   I personally reviewed Labs under Results section.  Radiology Reports No results found.     Assessment/Plan:  ETOH AND COCAINE POISONING INTOXICATION ENCEPHALOPATHY AND ACIDOSIS IMPROVED  START LIBRIUM OUT OF BED TO CHAIR SD STATUS TRANSFER TO TRH      Wallis Bamberg Santiago Glad, M.D.  [facility]  Medical Director [facility] Medical Director [facility] Cardio-Pulmonary Department

## 2022-12-10 NOTE — Plan of Care (Signed)
EEG: This study is within normal limits. The excessive beta activity seen in the background is most likely due to the effect of benzodiazepine and is a benign EEG pattern. No seizures or epileptiform discharges were seen throughout the recording.   Assessment/Recommendations: 42 year old male presenting with EtOH withdrawal seizure - Given that EEG is negative, can discontinue Keppra.  - Continue phenobarbital for now. Can taper off phenobarbital when out of the withdrawal time window.  - Substance abuse counseling for EtOH cessation. Will need a benzodiazepine taper to prevent seizure recurrence.  - Inpatient seizure precautions - Ativan PRN seizures and EtOH withdrawal symptoms - Neurohospitalist service will follow PRN. Please call if there are additional questions.   Electronically signed: Dr. Caryl Pina

## 2022-12-10 NOTE — Procedures (Signed)
Patient Name: Billy Wood  MRN: [id number]  Epilepsy Attending: Charlsie Quest  Referring Physician/Provider: Caryl Pina, MD  Date: 12/10/2022 Duration: 24.36 mins  Patient history: 42 year old male presenting with EtOH withdrawal seizure. EEG to evaluate for seizure.   Level of alertness: Awake  AEDs during EEG study: LEV, Ativan  Technical aspects: This EEG study was done with scalp electrodes positioned according to the 10-20 International system of electrode placement. Electrical activity was reviewed with band pass filter of 1-70Hz , sensitivity of 7 uV/mm, display speed of 51mm/sec with a 60Hz  notched filter applied as appropriate. EEG data were recorded continuously and digitally stored.  Video monitoring was available and reviewed as appropriate.  Description: No clear posterior dominant rhythm was seen. There is an excessive amount of 15 to 18 Hz beta activity distributed symmetrically and diffusely. Hyperventilation and photic stimulation were not performed.     ABNORMALITY - Excessive beta, generalized  IMPRESSION: This study is within normal limits. The excessive beta activity seen in the background is most likely due to the effect of benzodiazepine and is a benign EEG pattern. No seizures or epileptiform discharges were seen throughout the recording.  [name]

## 2022-12-10 NOTE — Consult Note (Signed)
Consultation Note Date: 12/10/2022 at 1430  Patient Name: Billy Wood  DOB: [birth year]  MRN: [id number]  Age / Sex: 42 y.o., male  PCP: Luciana Axe, NP Referring Physician: Erin Fulling, MD  Reason for Consultation: Establishing goals of care  HPI/Patient Profile: 42 y.o. male  with past medical history of alcohol and drug abuse (cocaine) admitted on 12/09/2022 for alcohol detox and subsequently had alcohol withdrawal related seizure in ED.  Patient is being treated with Librium, Ativan, Keppra, thiamine, and IV fluid resuscitation.  EEG was within normal limits.  PMT was consulted to discuss goals of care.   Clinical Assessment and Goals of Care: I have reviewed medical records including EPIC notes, labs and imaging, assessed the patient and then met with patient at bedside to discuss diagnosis prognosis, GOC, EOL wishes, disposition and options.  I introduced Palliative Medicine as specialized medical care for people living with serious illness. It focuses on providing relief from the symptoms and stress of a serious illness. The goal is to improve quality of life for both the patient and the family.  We discussed a brief life review of the patient.  Patient is divorced and has been living with his girlfriend Hospital doctor for 4 years.  He has 2 ad who live nearby.  Patient works for a [employer] claims [employer].  He states he is the only form of income for his family and therefore cannot go to long in the hospital or go to inpatient rehab because he needs to be able to go to work.  As far as functional and nutritional status patient endorses he has been vomiting every morning for the past week and unable to keep any kind of food down.  He denies difficulty with performing ADLs independently.  We discussed patient's current illness and what it means in the larger context of patient's on-going co-morbidities.  We  discussed his addiction to alcohol and use of illicit drugs.  Patient says he does not have a problem and can stop drinking any time.  He shares he is prepared to stop drinking and wants to have medications so that he "will throw up if I drink again".  I outlined that patient had been recommended naltrexone for opioid dependence several years ago.  He shared he would like to take this with Ativan.  I discussed that these medications have to be given with close follow-up and strict adherence.  He shares he is wanting to go home tomorrow and just wants the medication so he does not want to drink anymore.  I again attempted to discuss that his addiction is stronger than just detoxing for 1 day.  However, patient was resistant to talking about his alcohol and drug use at this time.  I attempted to elicit goals and values important to the patient.  Advance directives, status, and boundaries of care reviewed.    Patient agrees that in the event of a cardiopulmonary arrest that he would be accepting of all offered,available and appropriate medical interventions to sustain his  life.    He is also in agreement to use mechanical ventilatory support if he is unable to protect his own airway.    As far as advanced care planning,  I stated that in the absence of an HCPOA and spouse, then surrogate medical decision making would fall to patient's reasonably available adult children and parents. Patient first states he is fine with his mother making his decisions, However, he then stated he would want his girlfriend Hospital doctor to be his Runner, broadcasting/film/video. Further clarification and ACP education needed.   Discussed with patient the importance of continued conversation with family and the medical providers regarding overall plan of care and treatment options, ensuring decisions are within the context of the patient's values and GOCs. He continues to say he wants to go home tomorrow with ativan.   PMT contact info  given. Patient advised to contact PMT for any palliative needs. PMT will continue to follow.   I counseled with CCM Dr. Jeralene Huff and NP Delton See.  I recommended a psychological consult to discuss detox medications for patient.  Full code and full scope remain.   Primary Decision Maker PATIENT  Physical Exam Vitals reviewed.  Constitutional:      General: He is not in acute distress.    Appearance: He is not ill-appearing.  HENT:     Head: Normocephalic.     Mouth/Throat:     Mouth: Mucous membranes are moist.  Eyes:     Pupils: Pupils are equal, round, and reactive to light.  Cardiovascular:     Rate and Rhythm: Normal rate.  Pulmonary:     Effort: Pulmonary effort is normal.  Abdominal:     Palpations: Abdomen is soft.  Skin:    General: Skin is warm and dry.  Neurological:     Mental Status: He is alert.  Psychiatric:        Mood and Affect: Mood normal.        Behavior: Behavior normal.     Palliative Assessment/Data: 80% - reduced intake     Thank you for this consult. Palliative medicine will continue to follow and assist holistically.   Time Total: 75 minutes Greater than 50%  of this time was spent counseling and coordinating care related to the above assessment and plan.  Signed by: Georgiann Cocker, DNP, FNP-BC Palliative Medicine    Please contact Palliative Medicine Team phone at [phone] for questions and concerns.  For individual provider: See Loretha Stapler

## 2022-12-10 NOTE — Progress Notes (Signed)
Eeg done 

## 2022-12-11 ENCOUNTER — Other Ambulatory Visit: Payer: Self-pay | Admitting: Internal Medicine

## 2022-12-11 DIAGNOSIS — F1020 Alcohol dependence, uncomplicated: Secondary | ICD-10-CM

## 2022-12-11 MED ORDER — NALTREXONE HCL 50 MG PO TABS
25.0000 mg | ORAL_TABLET | Freq: Every day | ORAL | Status: DC
  Administered 2022-12-11: 25 mg via ORAL
  Filled 2022-12-11: qty 1

## 2022-12-11 MED ORDER — PANTOPRAZOLE SODIUM 40 MG PO TBEC: 40.0000 mg | DELAYED_RELEASE_TABLET | Freq: Every day | ORAL | 1 refills | Status: DC

## 2022-12-11 MED ORDER — POTASSIUM CHLORIDE CRYS ER 20 MEQ PO TBCR
40.0000 meq | EXTENDED_RELEASE_TABLET | Freq: Once | ORAL | Status: AC
  Administered 2022-12-11: 40 meq via ORAL
  Filled 2022-12-11: qty 2

## 2022-12-11 MED ORDER — FOLIC ACID 1 MG PO TABS: 1.0000 mg | ORAL_TABLET | Freq: Every day | ORAL | 3 refills | Status: AC

## 2022-12-11 MED ORDER — CHLORDIAZEPOXIDE HCL 5 MG PO CAPS: ORAL_CAPSULE | ORAL | 0 refills | Status: DC

## 2022-12-11 MED ORDER — OMEPRAZOLE MAGNESIUM 20 MG PO TBEC: 40.0000 mg | DELAYED_RELEASE_TABLET | Freq: Every day | ORAL | Status: DC

## 2022-12-11 MED ORDER — ADULT MULTIVITAMIN W/MINERALS CH: 1.0000 | ORAL_TABLET | Freq: Every day | ORAL | Status: DC

## 2022-12-11 MED ORDER — THIAMINE HCL 100 MG PO TABS: 100.0000 mg | ORAL_TABLET | Freq: Every day | ORAL | 3 refills | Status: DC

## 2022-12-11 MED ORDER — CHLORDIAZEPOXIDE HCL 5 MG PO CAPS: ORAL_CAPSULE | ORAL | 0 refills | Status: AC

## 2022-12-11 MED ORDER — NALTREXONE HCL 50 MG PO TABS: 50.0000 mg | ORAL_TABLET | Freq: Every day | ORAL | 0 refills | Status: DC

## 2022-12-11 NOTE — Plan of Care (Signed)

## 2022-12-11 NOTE — Discharge Summary (Signed)
Physician Discharge Summary   Patient: Billy Wood MRN: [id number] DOB: 05/22/1981  Admit date:     12/09/2022  Discharge date: 12/12/22  Discharge Physician: Pennie Banter   PCP: Luciana Axe, NP   Recommendations at discharge:   Follow up with Primary Care within 1 week Repeat CMP, Mg, CBC within 1 week Follow at up [facility] for mental health and substance abuse recovery Continue naltrexone if efficacious and tolerated  Discharge Diagnoses: Principal Problem:   Alcohol use disorder, severe, dependence (HCC) Active Problems:   Essential hypertension   Hypokalemia   Thrombocytopenia (HCC)   Elevated liver function tests   Alcohol withdrawal (HCC)   Protein-calorie malnutrition, severe (HCC)   Hyponatremia   Polysubstance abuse (HCC)  Resolved Problems:   Hypomagnesemia   Alcohol withdrawal seizure (HCC)   Toxic encephalopathy   Hypophosphatemia  Hospital Course: HPI on admission to ICU 12/09/22:  "42 y.o. male with past medical history of hypertension, alcohol abuse, and  seizures who presents to the ED for alcohol seizures/ETOH intoxication liver failure and acidosis   Patient initially arrived via private vehicle, stating his last drink was last night and that he was now having symptoms of withdrawal after drinking for 6 weeks straight.     He was being brought back to a room when he had a generalized tonic-clonic seizure lasting for about 1 minute.   Reports of last drink was last night and he had been having about 12 airplane bottles of liquor daily.     Patient also admitted to his wife that he used cocaine 1 week ago, but she is not aware of any other substance abuse.  Wife reports that patient has a history of alcohol withdrawal seizures, does not usually take seizure medication.    ER course Patient was given ativan, keppra, phenobarb for seizure"  Patient was admitted to ICU with severe alcohol intoxication complicated by withdrawal seizure. He was  treated with IV Keppra and phenobarbital. Monitored on CIWA protocol for management of withdrawal with PRN Ativan. Started on scheduled Librium with good effect.  Neurology was consulted.  EEG was negative.  Keppra was stopped. See by Psychiatry.  Pt agreeable to start naltrexone for alcohol dependence.   12/11/22 -- pt doing well.  No tremor or withdrawal signs/symptoms on Librium.  Has not required and PRN Ativan.  Reports eating and drinking well.  Mother at bedside on rounds. Patient requests discharge home today.  We discussed with abnormal electrolytes, would benefit from remaining in hospital to continue close monitoring in case further repaclement is needed.  He declined to stay, stating he would make follow up with primary care and would drink electrolyte drinks at home.  Discharging patient home with longer Librium taper given extent of his alcohol use and dependence, to prevent recurrent withdrawal seizure.  Naltrexone Rx sent with patient understanding he needs to see PCP or other provider that will continue it for him.          Consultants: PCCM Procedures performed: None  Disposition: Home Diet recommendation:  Discharge Diet Orders (From admission, onward)     Start     Ordered   12/11/22 0000  Diet - low sodium heart healthy        12/11/22 1119           Regular diet DISCHARGE MEDICATION: Allergies as of 12/11/2022   No Known Allergies      Medication List     STOP taking these medications  pantoprazole 40 MG tablet Commonly known as: PROTONIX       TAKE these medications    folic acid 1 MG tablet Commonly known as: FOLVITE Take 1 tablet (1 mg total) by mouth daily.   ibuprofen 800 MG tablet Commonly known as: ADVIL Take 1 tablet (800 mg total) by mouth every 8 (eight) hours as needed. Take with food   multivitamin with minerals Tabs tablet Take 1 tablet by mouth daily.   naltrexone 50 MG tablet Commonly known as: DEPADE Take 1 tablet  (50 mg total) by mouth daily.   omeprazole 20 MG tablet Commonly known as: PriLOSEC OTC Take 2 tablets (40 mg total) by mouth daily.   thiamine 100 MG tablet Commonly known as: VITAMIN B1 Take 1 tablet (100 mg total) by mouth daily.        Follow-up Information     Luciana Axe, NP. Schedule an appointment as soon as possible for a visit.   Specialty: Family Medicine Why: Hospital follow up as soon as possible, within 1-2 weeks (Per office - Eileen Stanford will call patient next week to discuss appt; patient has missed too many appts) Contact information: [address] [place] [address] [phone]         [facility] Call.   Why: Local mental health practice for support with alcohol / substance abuse.  Recommend call for appointment. Contact information: [address] [place] [address] [phone]                Discharge Exam: Filed Weights   12/09/22 1215 12/12/2022 0500  Weight: 59 kg 63.4 kg   General exam: awake, alert, no acute distress HEENT: atraumatic, clear conjunctiva, anicteric sclera, moist mucus membranes, hearing grossly normal  Respiratory system: CTAB, no wheezes, rales or rhonchi, normal respiratory effort. Cardiovascular system: normal S1/S2, RRR, no pedal edema.   Gastrointestinal system: soft, NT, ND, no HSM felt, +bowel sounds. Central nervous system: A&O x 4. no gross focal neurologic deficits, no tremor of outstretched hands Extremities: moves all, no edema, normal tone Skin: dry, intact, normal temperature, normal color, No rashes, lesions or ulcers Psychiatry: normal mood, congruent affect, judgement and insight appear normal   Condition at discharge: stable  The results of significant diagnostics from this hospitalization (including imaging, microbiology, ancillary and laboratory) are listed below for reference.   Imaging Studies: EEG adult  Result Date: 12/12/22 Charlsie Quest, MD      12/12/22 12:25 PM Patient Name: [name] MRN: [id number] Epilepsy Attending: Charlsie Quest Referring Physician/Provider: Caryl Pina, MD Date: Dec 12, 2022 Duration: 24.36 mins Patient history: 42 year old male presenting with EtOH withdrawal seizure. EEG to evaluate for seizure. Level of alertness: Awake AEDs during EEG study: LEV, Ativan Technical aspects: This EEG study was done with scalp electrodes positioned according to the 10-20 International system of electrode placement. Electrical activity was reviewed with band pass filter of 1-70Hz , sensitivity of 7 uV/mm, display speed of 46mm/sec with a 60Hz  notched filter applied as appropriate. EEG data were recorded continuously and digitally stored.  Video monitoring was available and reviewed as appropriate. Description: No clear posterior dominant rhythm was seen. There is an excessive amount of 15 to 18 Hz beta activity distributed symmetrically and diffusely. Hyperventilation and photic stimulation were not performed.   ABNORMALITY - Excessive beta, generalized IMPRESSION: This study is within normal limits. The excessive beta activity seen in the background is most likely due to the effect of benzodiazepine  and is a benign EEG pattern. No seizures or epileptiform discharges were seen throughout the recording. Priyanka Annabelle Harman   DG Ribs Unilateral W/Chest Left  Result Date: 11/12/2022 CLINICAL DATA:  Larey Seat with trauma to the left side of the chest. EXAM: LEFT RIBS AND CHEST - 3+ VIEW COMPARISON:  None Available. FINDINGS: Heart and mediastinal shadows are normal. The lungs are clear. No pneumothorax or hemothorax. Left-sided rib films show a questionable minimal fracture of the anterior left ninth rib. IMPRESSION: 1. No active cardiopulmonary disease. No pneumothorax or hemothorax. 2. Question minimal fracture of the anterior left ninth rib. Electronically Signed   By: Paulina Fusi M.D.   On: 11/12/2022 16:19    Microbiology: Results for orders  placed or performed during the hospital encounter of 12/09/22  MRSA Next Gen by PCR, Nasal     Status: None   Collection Time: 12/09/22  3:35 PM   Specimen: Nasal Mucosa; Nasal Swab  Result Value Ref Range Status   MRSA by PCR Next Gen NOT DETECTED NOT DETECTED Final    Comment: (NOTE) The GeneXpert MRSA Assay (FDA approved for NASAL specimens only), is one component of a comprehensive MRSA colonization surveillance program. It is not intended to diagnose MRSA infection nor to guide or monitor treatment for MRSA infections. Test performance is not FDA approved in patients less than 93 years old. Performed at [facility], [address] [place], [place] [address]     Labs: CBC: Recent Labs  Lab 12/09/22 1245 12/09/22 2350  WBC 8.4 5.5  HGB 15.4 13.1  HCT 42.0 34.9*  MCV 92.7 91.1  PLT 101* 68*   Basic Metabolic Panel: Recent Labs  Lab 12/09/22 1245 12/09/22 1538 12/09/22 2350  NA 132*  --  131*  K 2.4*  --  3.2*  CL 91*  --  93*  CO2 15*  --  29  GLUCOSE 158*  --  112*  BUN 8  --  6  CREATININE 0.93  --  0.59*  CALCIUM 8.6*  --  7.8*  MG 1.2* 1.8 1.9  PHOS  --  1.8* 2.7   Liver Function Tests: Recent Labs  Lab 12/09/22 1245 12/09/22 2350  AST 343*  --   ALT 128*  --   ALKPHOS 138*  --   BILITOT 2.2*  --   PROT 7.3  --   ALBUMIN 4.0 3.2*   CBG: Recent Labs  Lab 12/09/22 1541  GLUCAP 99    Discharge time spent: greater than 30 minutes.  Signed: Pennie Banter, DO [facility] 12/12/2022

## 2022-12-11 NOTE — Discharge Instructions (Signed)
                  Intensive Outpatient Programs  [facility]    [facility] [address]     [address] [place],  [place]     [place], [place] [phone]      [phone]  [facility]   [facility]  (Inpatient and outpatient)  [phone] (Suboxone and Methadone) [address]           [phone]           [facility]    [facility] - Intensive Outpatient [address]     [address] [place], [place] [address]     [place], [place]  [phone]      [phone]  [facility] (Outpatient, Inpatient, [facility]  [facility] (Groups and Residental) (insurance only) [phone]    [place], [place]          [phone]       [facility]    [facility] (for caregivers and family) [address]     [address] [place], [place]     [place], [place] [phone]      [phone]  Residential Treatment Programs  [facility]  [facility](2 years) Residential: 90 days)  [facility] [address]      [address] [place], [place]     [place], [place] [phone]      [phone] or [phone]  [facility]    [facility] [address]      [address] [place], [place]     [place], [place] [phone]      [phone]  [facility]   [facility] ([facility] [address]     [address] [place], [place] [address]     , [place] [phone]      [phone] Admissions: 8am-3pm M-F  [facility]: Residential Program (90 Days)              [facility]: [facility]  [place], [place]     [place], [place]  [phone] or [phone]    (Walk in Hours over the weekend or by referral)   [facility]: [facility]:[phone] (for crisis  response 24 hours a day) 

## 2022-12-11 NOTE — Progress Notes (Signed)
Palliative Care Progress Note, Assessment & Plan   Patient Name: Billy Wood       Date: 12/11/2022 DOB: [birth year]  Age: 42 y.o. MRN#: [id number] Attending Physician: Pennie Banter, DO Primary Care Physician: Luciana Axe, NP Admit Date: 12/09/2022  Subjective: Patient is lying in bed in no apparent distress.  He acknowledges my presence and is able to make his wishes known.  He has no acute complaints at this time.  No family or friends present at bedside.  He is saying he feels well and is ready to go home today.  HPI: 42 y.o. male  with past medical history of alcohol and drug abuse (cocaine) admitted on 12/09/2022 for alcohol detox and subsequently had alcohol withdrawal related seizure in ED.   Patient is being treated with Librium, Ativan, Keppra, thiamine, and IV fluid resuscitation.  EEG was within normal limits.   PMT was consulted to discuss goals of care.  Summary of counseling/coordination of care: After reviewing the patient's chart and assessing the patient at bedside, I discussed plan and goals of care with patient.  Patient says he is ready to go home.  He would like medication to help him not drink in the future.  He shares he does not think he needs to stay in the hospital any longer.  We discussed use of medications such as naltrexone for opioid use/dependence.  Patient requested the "medication that makes me throw up if I drink alcohol". WE reviewed antabuse and naltrexone are given under medical supervision and require outpatient follow up. Patient states he has a lot to take care of outside of the hospital and just wants to go home with his medications.   I conveyed that psyc has been consulted to discuss medical management of his polysusbstance abuse. He shares he is  fine with that as long as he can meet with them after he is discharged from the hospital.   Patient politely adamant and fixated on wanting to go home today. He again confirmed full code and full scope.   PMT will remain available to patient throughout his hospitalization but monitor him peripherally/intermittently.   Please re-engage with PMT if goals change, at patient/family's request, or if patient's health deteriorates during this hospitalization.   Physical Exam Constitutional:      General: He is not in acute distress.    Appearance: He is normal weight.  HENT:     Head: Normocephalic.     Mouth/Throat:     Mouth: Mucous membranes are moist.  Eyes:     Pupils: Pupils are equal, round, and reactive to light.  Cardiovascular:     Rate and Rhythm: Normal rate.  Pulmonary:     Effort: Pulmonary effort is normal.  Abdominal:     Palpations: Abdomen is soft.  Musculoskeletal:        General: Normal range of motion.  Skin:    General: Skin is warm and dry.  Neurological:     Mental Status: He is alert and oriented to person, place, and time.  Psychiatric:        Mood and Affect: Mood normal.        Behavior: Behavior normal.  Thought Content: Thought content normal.        Judgment: Judgment normal.             Total Time 25 minutes   Denarius Sesler L. Manon Hilding, FNP-BC Palliative Medicine Team Team Phone # [phone]

## 2022-12-11 NOTE — Progress Notes (Signed)
Sending Librium taper to different pharmacy at request of patient's mother. Apparently original pharmacy was unable to fill it.

## 2022-12-11 NOTE — Plan of Care (Signed)
  Problem: Education: Goal: Knowledge of General Education information will improve Description: Including pain rating scale, medication(s)/side effects and non-pharmacologic comfort measures 12/11/2022 1228 by Dillard Essex, RN Outcome: Completed/Met 12/11/2022 1227 by Dillard Essex, RN Outcome: Not Progressing 12/11/2022 1219 by Dillard Essex, RN Outcome: Progressing   Problem: Health Behavior/Discharge Planning: Goal: Ability to manage health-related needs will improve 12/11/2022 1228 by Dillard Essex, RN Outcome: Completed/Met 12/11/2022 1227 by Dillard Essex, RN Outcome: Not Progressing 12/11/2022 1219 by Dillard Essex, RN Outcome: Progressing   Problem: Clinical Measurements: Goal: Ability to maintain clinical measurements within normal limits will improve 12/11/2022 1228 by Dillard Essex, RN Outcome: Completed/Met 12/11/2022 1227 by Dillard Essex, RN Outcome: Not Progressing 12/11/2022 1219 by Dillard Essex, RN Outcome: Progressing Goal: Will remain free from infection 12/11/2022 1228 by Dillard Essex, RN Outcome: Completed/Met 12/11/2022 1227 by Dillard Essex, RN Outcome: Not Progressing 12/11/2022 1219 by Dillard Essex, RN Outcome: Progressing Goal: Diagnostic test results will improve 12/11/2022 1228 by Dillard Essex, RN Outcome: Completed/Met 12/11/2022 1227 by Dillard Essex, RN Outcome: Not Progressing 12/11/2022 1219 by Dillard Essex, RN Outcome: Progressing Goal: Respiratory complications will improve 12/11/2022 1228 by Dillard Essex, RN Outcome: Completed/Met 12/11/2022 1227 by Dillard Essex, RN Outcome: Not Progressing 12/11/2022 1219 by Dillard Essex, RN Outcome: Progressing Goal: Cardiovascular complication will be avoided 12/11/2022 1228 by Dillard Essex, RN Outcome: Completed/Met 12/11/2022 1227 by Dillard Essex, RN Outcome: Not Progressing 12/11/2022 1219 by Dillard Essex, RN Outcome: Progressing   Problem:  Activity: Goal: Risk for activity intolerance will decrease 12/11/2022 1228 by Dillard Essex, RN Outcome: Completed/Met 12/11/2022 1227 by Dillard Essex, RN Outcome: Not Progressing 12/11/2022 1219 by Dillard Essex, RN Outcome: Progressing   Problem: Nutrition: Goal: Adequate nutrition will be maintained 12/11/2022 1228 by Dillard Essex, RN Outcome: Completed/Met 12/11/2022 1227 by Dillard Essex, RN Outcome: Not Progressing 12/11/2022 1219 by Dillard Essex, RN Outcome: Progressing   Problem: Coping: Goal: Level of anxiety will decrease 12/11/2022 1228 by Dillard Essex, RN Outcome: Completed/Met 12/11/2022 1227 by Dillard Essex, RN Outcome: Not Progressing 12/11/2022 1219 by Dillard Essex, RN Outcome: Progressing   Problem: Elimination: Goal: Will not experience complications related to bowel motility 12/11/2022 1228 by Dillard Essex, RN Outcome: Completed/Met 12/11/2022 1227 by Dillard Essex, RN Outcome: Not Progressing 12/11/2022 1219 by Dillard Essex, RN Outcome: Progressing Goal: Will not experience complications related to urinary retention 12/11/2022 1228 by Dillard Essex, RN Outcome: Completed/Met 12/11/2022 1227 by Dillard Essex, RN Outcome: Not Progressing 12/11/2022 1219 by Dillard Essex, RN Outcome: Progressing   Problem: Pain Managment: Goal: General experience of comfort will improve 12/11/2022 1228 by Dillard Essex, RN Outcome: Completed/Met 12/11/2022 1227 by Dillard Essex, RN Outcome: Not Progressing 12/11/2022 1219 by Dillard Essex, RN Outcome: Progressing   Problem: Safety: Goal: Ability to remain free from injury will improve 12/11/2022 1228 by Dillard Essex, RN Outcome: Completed/Met 12/11/2022 1227 by Dillard Essex, RN Outcome: Not Progressing 12/11/2022 1219 by Dillard Essex, RN Outcome: Progressing   Problem: Skin Integrity: Goal: Risk for impaired skin integrity will decrease 12/11/2022 1228 by Dillard Essex,  RN Outcome: Completed/Met 12/11/2022 1227 by Dillard Essex, RN Outcome: Not Progressing 12/11/2022 1219 by Dillard Essex, RN Outcome: Progressing

## 2022-12-11 NOTE — Progress Notes (Signed)
Patient discharged to Home accompanied by mother with all belongings. A+Ox4. Medications and discharge instructions reviewed together with patient and family.  All questions answered. PIV x 3 removed, no bleeding, intact. Patient and family verbalized understanding of signs and symptoms of infection.  Patient and family satisfied with overall care at [facility] - [facility].

## 2022-12-11 NOTE — Consult Note (Signed)
[facility] Face-to-Face Psychiatry Consult   Reason for Consult:  Polysubstance abuse  Referring Physician:  Erin Fulling, MD Patient Identification: Billy Wood MRN:  [id number] Principal Diagnosis: Alcohol use disorder, severe, dependence (HCC) Diagnosis:  Principal Problem:   Alcohol use disorder, severe, dependence (HCC) Active Problems:   Alcohol withdrawal seizure (HCC)   Alcohol withdrawal (HCC)   Encephalopathy   Total Time spent with patient: 1 hour  Subjective:  "extreme alcohol withdrawals and withdrawal seizures."   Billy Wood is a 42 y.o. male patient is being seen at the request of the medical team for polysubstance abuse treatment .  HPI:  Billy Wood is a 42 y.o. male with a history of hypertension, polysubstance, alcohol abuse and alcohol withdrawal seizures presented to the emergency department on 04/30 for alcohol withdrawal.  The patient was subsequently admitted to the medical floor where he is seen today.  Patient seen and chart reviewed. The patient reports drinking 10-12 airplane bottles of vodka daily, with the last drink prior to coming to the hospital.  The patient reports he has attended daily AA meetings in the past but relapsed, with the longest period of sobriety being 1 to 2 days.  He denies specific stress sores or triggers, stating that he feels the urge to drink due to habituation.  The patient reports his alcohol use began as social drinking and progressive dependence.  He describes his sleep as moderate and occasionally takes Tylenol PM for sleep difficulties.  The patient denies experiencing bad dreams, nightmares, feelings of worthlessness, or hopelessness.  He reports satisfactory energy levels and denies any concentration difficulties.  The patient reports his appetite is improving, and he consumes boost and V8 supplements.  He denies agitation or restlessness, stating that the symptoms have subsided.  The patient denies suicidal ideation, past  suicide attempts, homicidal ideation, auditory or visual hallucinations.  He reports he lives with his girlfriend and plans to seek intensive outpatient treatment after discharge.  The patient denies depression, sadness, history of trauma, abuse, or neglect.  He admits to occasional cocaine use, most recently 2 weeks ago.   During the evaluation, patient is alert, sitting up in bed.  Minimal upper body tremors noted, though he denies feeling shaky or tremulous. He was cooperative with clear and coherent speech, he answered questions appropriately.  Attention and perception normal.  He does not appear to be responding to stimuli.  There is no evidence of delusional thinking.   Ethyl alcohol 90 on admission. UDS remarkable for cocaine, benzodiazepines and barbiturates (likely received in the emergency department for alcohol withdrawal seizure and detox). Discussed starting naltrexone 25 mg daily to assist with cravings.  Patient educated on medication including adverse reactions/side effects; patient is agreeable.  Spoke with mother, Haneef Hallquist) at the bedside, no safety concerns, only worried about patient falling.  Past Psychiatric History: Alcohol abuse, Polysubstance abuse  Risk to Self:  No  Risk to Others:  No  Prior Inpatient Therapy:   Prior Outpatient Therapy:    Past Medical History:  Past Medical History:  Diagnosis Date   Alcohol abuse    Substance abuse (HCC)     Past Surgical History:  Procedure Laterality Date   NO PAST SURGERIES     Family History:  Family History  Problem Relation Age of Onset   Alcohol abuse Mother    Alcohol abuse Father    Family Psychiatric  History: No pertinent family psychiatric history on file.  Social History:  Social History  Substance and Sexual Activity  Alcohol Use Yes   Alcohol/week: 126.0 - 168.0 standard drinks of alcohol   Types: 84 Cans of beer, 42 - 84 Shots of liquor per week   Comment: occasional      Social History    Substance and Sexual Activity  Drug Use Yes   Types: Cocaine    Social History   Socioeconomic History   Marital status: Single    Spouse name: Not on file   Number of children: Not on file   Years of education: Not on file   Highest education level: Not on file  Occupational History   Not on file  Tobacco Use   Smoking status: Never   Smokeless tobacco: Never  Vaping Use   Vaping Use: Never used  Substance and Sexual Activity   Alcohol use: Yes    Alcohol/week: 126.0 - 168.0 standard drinks of alcohol    Types: 84 Cans of beer, 42 - 84 Shots of liquor per week    Comment: occasional    Drug use: Yes    Types: Cocaine   Sexual activity: Yes  Other Topics Concern   Not on file  Social History Narrative   Not on file   Social Determinants of Health   Financial Resource Strain: Not on file  Food Insecurity: Food Insecurity Present (12/10/2022)   Hunger Vital Sign    Worried About Running Out of Food in the Last Year: Never true    Ran Out of Food in the Last Year: Often true  Transportation Needs: No Transportation Needs (12/10/2022)   PRAPARE - Administrator, Civil Service (Medical): No    Lack of Transportation (Non-Medical): No  Physical Activity: Not on file  Stress: Not on file  Social Connections: Not on file   Additional Social History:    Allergies:  No Known Allergies  Labs:  Results for orders placed or performed during the hospital encounter of 12/09/22 (from the past 48 hour(s))  Renal function panel     Status: Abnormal   Collection Time: 12/09/22 11:50 PM  Result Value Ref Range   Sodium 131 (L) 135 - 145 mmol/L   Potassium 3.2 (L) 3.5 - 5.1 mmol/L   Chloride 93 (L) 98 - 111 mmol/L   CO2 29 22 - 32 mmol/L   Glucose, Bld 112 (H) 70 - 99 mg/dL    Comment: Glucose reference range applies only to samples taken after fasting for at least 8 hours.   BUN 6 6 - 20 mg/dL   Creatinine, Ser 4.09 (L) 0.61 - 1.24 mg/dL   Calcium 7.8 (L) 8.9 -  10.3 mg/dL   Phosphorus 2.7 2.5 - 4.6 mg/dL   Albumin 3.2 (L) 3.5 - 5.0 g/dL   GFR, Estimated >81 >19 mL/min    Comment: (NOTE) Calculated using the CKD-EPI Creatinine Equation (2021)    Anion gap 9 5 - 15    Comment: Performed at [facility], [address] [place], [place] [address]  Magnesium     Status: None   Collection Time: 12/09/22 11:50 PM  Result Value Ref Range   Magnesium 1.9 1.7 - 2.4 mg/dL    Comment: Performed at [facility], [address] [place], [place] [address]  CBC     Status: Abnormal   Collection Time: 12/09/22 11:50 PM  Result Value Ref Range   WBC 5.5 4.0 - 10.5 K/uL   RBC 3.83 (L) 4.22 - 5.81 MIL/uL  Hemoglobin 13.1 13.0 - 17.0 g/dL   HCT 16.1 (L) 09.6 - 04.5 %   MCV 91.1 80.0 - 100.0 fL   MCH 34.2 (H) 26.0 - 34.0 pg   MCHC 37.5 (H) 30.0 - 36.0 g/dL   RDW 40.9 81.1 - 91.4 %   Platelets 68 (L) 150 - 400 K/uL    Comment: Immature Platelet Fraction may be clinically indicated, consider ordering this additional test [id number] PLATELET COUNT CONFIRMED BY SMEAR    nRBC 0.0 0.0 - 0.2 %    Comment: Performed at [facility], [address] [place], [place] [address]  Lactic acid, plasma     Status: None   Collection Time: 12/09/22 11:50 PM  Result Value Ref Range   Lactic Acid, Venous 1.3 0.5 - 1.9 mmol/L    Comment: Performed at [facility] [address], [place], [place] [address]  Urine Drug Screen, Qualitative     Status: Abnormal   Collection Time: 12/10/22  1:00 AM  Result Value Ref Range   Tricyclic, Ur Screen NONE DETECTED NONE DETECTED   Amphetamines, Ur Screen NONE DETECTED NONE DETECTED   MDMA (Ecstasy)Ur Screen NONE DETECTED NONE DETECTED   Cocaine Metabolite,Ur [place] POSITIVE (A) NONE DETECTED   Opiate, Ur Screen NONE DETECTED NONE DETECTED   Phencyclidine (PCP) Ur S NONE DETECTED NONE DETECTED   Cannabinoid 50 Ng, Ur [place] NONE DETECTED NONE DETECTED   Barbiturates, Ur Screen POSITIVE (A) NONE  DETECTED   Benzodiazepine, Ur Scrn POSITIVE (A) NONE DETECTED   Methadone Scn, Ur NONE DETECTED NONE DETECTED    Comment: (NOTE) Tricyclics + metabolites, urine    Cutoff 1000 ng/mL Amphetamines + metabolites, urine  Cutoff 1000 ng/mL MDMA (Ecstasy), urine              Cutoff 500 ng/mL Cocaine Metabolite, urine          Cutoff 300 ng/mL Opiate + metabolites, urine        Cutoff 300 ng/mL Phencyclidine (PCP), urine         Cutoff 25 ng/mL Cannabinoid, urine                 Cutoff 50 ng/mL Barbiturates + metabolites, urine  Cutoff 200 ng/mL Benzodiazepine, urine              Cutoff 200 ng/mL Methadone, urine                   Cutoff 300 ng/mL  The urine drug screen provides only a preliminary, unconfirmed analytical test result and should not be used for non-medical purposes. Clinical consideration and professional judgment should be applied to any positive drug screen result due to possible interfering substances. A more specific alternate chemical method must be used in order to obtain a confirmed analytical result. Gas chromatography / mass spectrometry (GC/MS) is the preferred confirm atory method. Performed at [facility], [address] [place], [place] [address]     Current Facility-Administered Medications  Medication Dose Route Frequency Provider Last Rate Last Admin   0.9 %  sodium chloride infusion  250 mL Intravenous PRN Erin Fulling, MD       acetaminophen (TYLENOL) tablet 650 mg  650 mg Oral Q4H PRN Erin Fulling, MD       chlordiazePOXIDE (LIBRIUM) capsule 25 mg  25 mg Oral TID Erin Fulling, MD   25 mg at 12/11/22 0936   Chlorhexidine Gluconate Cloth 2 % PADS 6 each  6 each Topical  Daily Erin Fulling, MD   6 each at 12/11/22 1000   docusate sodium (COLACE) capsule 100 mg  100 mg Oral BID PRN Erin Fulling, MD       folic acid injection 1 mg  1 mg Intravenous Daily Lowella Bandy, RPH   1 mg at 12/11/22 4540   heparin injection 5,000 Units  5,000 Units  Subcutaneous Q8H Erin Fulling, MD   5,000 Units at 12/11/22 9811   hydrALAZINE (APRESOLINE) injection 10-40 mg  10-40 mg Intravenous Q4H PRN Erin Fulling, MD       LORazepam (ATIVAN) tablet 1-4 mg  1-4 mg Oral Q1H PRN Rust-Chester, Cecelia Byars, NP       Or   LORazepam (ATIVAN) injection 1-4 mg  1-4 mg Intravenous Q1H PRN Rust-Chester, Cecelia Byars, NP       LORazepam (ATIVAN) tablet 0-4 mg  0-4 mg Oral Q4H Rust-Chester, Britton L, NP   1 mg at 12/11/22 1314   Followed by   Melene Muller ON 12/12/2022] LORazepam (ATIVAN) tablet 0-4 mg  0-4 mg Oral Q8H Rust-Chester, Britton L, NP       methylPREDNISolone sodium succinate (SOLU-MEDROL) 40 mg/mL injection 40 mg  40 mg Intravenous Q24H Erin Fulling, MD   40 mg at 12/10/22 1734   multivitamin with minerals tablet 1 tablet  1 tablet Oral Daily Rust-Chester, Cecelia Byars, NP   1 tablet at 12/11/22 0936   naltrexone (DEPADE) tablet 25 mg  25 mg Oral Daily Jayelyn Barno H, NP   25 mg at 12/11/22 1314   ondansetron (ZOFRAN) injection 4 mg  4 mg Intravenous Q6H PRN Erin Fulling, MD       pantoprazole (PROTONIX) injection 40 mg  40 mg Intravenous Q24H Erin Fulling, MD   40 mg at 12/11/22 1245   polyethylene glycol (MIRALAX / GLYCOLAX) packet 17 g  17 g Oral Daily PRN Erin Fulling, MD       sodium chloride flush (NS) 0.9 % injection 3 mL  3 mL Intravenous Q12H Erin Fulling, MD   3 mL at 12/11/22 0937   sodium chloride flush (NS) 0.9 % injection 3 mL  3 mL Intravenous PRN Erin Fulling, MD       sodium phosphate 30 mmol in dextrose 5 % 250 mL infusion  30 mmol Intravenous Once Lowella Bandy, RPH       thiamine (VITAMIN B1) 500 mg in sodium chloride 0.9 % 50 mL IVPB  500 mg Intravenous Q24H Erin Fulling, MD   Stopped at 12/10/22 1717   Current Outpatient Medications  Medication Sig Dispense Refill   chlordiazePOXIDE (LIBRIUM) 5 MG capsule Take 4 capsules (20 mg total) by mouth 3 (three) times daily for 3 days, THEN 3 capsules (15 mg total) 3 (three) times daily for 3 days,  THEN 2 capsules (10 mg total) 3 (three) times daily for 3 days, THEN 1 capsule (5 mg total) 3 (three) times daily for 3 days, THEN 1 capsule (5 mg total) in the morning and at bedtime for 3 days. 96 capsule 0   folic acid (FOLVITE) 1 MG tablet Take 1 tablet (1 mg total) by mouth daily. 30 tablet 3   ibuprofen (ADVIL) 800 MG tablet Take 1 tablet (800 mg total) by mouth every 8 (eight) hours as needed. Take with food 21 tablet 0   naltrexone (DEPADE) 50 MG tablet Take 1 tablet (50 mg total) by mouth daily. 30 tablet 0   omeprazole (PRILOSEC OTC) 20 MG tablet Take 2  tablets (40 mg total) by mouth daily.     thiamine (VITAMIN B1) 100 MG tablet Take 1 tablet (100 mg total) by mouth daily. 30 tablet 3   [START ON 12/12/2022] Multiple Vitamin (MULTIVITAMIN WITH MINERALS) TABS tablet Take 1 tablet by mouth daily.      Musculoskeletal: Strength & Muscle Tone: decreased Gait & Station:  Did not assess  Patient leans: N/A            Psychiatric Specialty Exam:  Presentation  General Appearance: Appropriate for Environment  Eye Contact:Good  Speech:Clear and Coherent  Speech Volume:Normal  Handedness:Right   Mood and Affect  Mood:Anxious  Affect:Congruent   Thought Process  Thought Processes:Coherent  Descriptions of Associations:Intact  Orientation:Full (Time, Place and Person)  Thought Content:WDL; Logical  History of Schizophrenia/Schizoaffective disorder:No data recorded Duration of Psychotic Symptoms:No data recorded Hallucinations:Hallucinations: None  Ideas of Reference:None  Suicidal Thoughts:Suicidal Thoughts: No  Homicidal Thoughts:Homicidal Thoughts: No   Sensorium  Memory:Immediate Good; Recent Good  Judgment:Fair  Insight:Lacking   Executive Functions  Concentration:Good  Attention Span:Good  Recall:Good  Fund of Knowledge:Good  Language:Good   Psychomotor Activity  Psychomotor Activity:Psychomotor Activity: Tremor (Minimal upper  body tremors)   Assets  Assets:Communication Skills; Desire for Improvement; Housing; Social Support   Sleep  Sleep:Sleep: Fair   Physical Exam: Physical Exam Vitals and nursing note reviewed.  HENT:     Head: Normocephalic.     Nose: Nose normal.  Pulmonary:     Effort: Pulmonary effort is normal.  Musculoskeletal:     Cervical back: Normal range of motion.  Neurological:     Mental Status: He is alert and oriented to person, place, and time.     Comments: Slight tremors    Psychiatric:        Attention and Perception: Attention and perception normal. He does not perceive auditory or visual hallucinations.        Mood and Affect: Mood is anxious.        Speech: Speech normal.        Behavior: Behavior is cooperative.        Thought Content: Thought content normal. Thought content is not paranoid or delusional. Thought content does not include homicidal or suicidal ideation.        Cognition and Memory: Cognition and memory normal.        Judgment: Judgment normal.    ROS Blood pressure 110/84, pulse 75, temperature 98.1 F (36.7 C), resp. rate 16, height 6' (1.829 m), weight 63.4 kg, SpO2 100 %. Body mass index is 18.96 kg/m.  Treatment Plan Summary: Case discussed with attending DO, Dr. Denton Lank.  Alcohol use disorder.  Encouraged patient to stop drinking.  Intensive outpatient treatment recommended.  Encouraged to reinstate daily AA meetings.  Patient strongly encouraged to abstain from alcohol and illicit substances.  There is no evidence of acute psychiatric illness, delusional thinking or response to internal stimuli.  No evidence of suicidal or homicidal ideation.  Patient does not meet criteria for inpatient psychiatric hospitalization at this time.  Plan :Initiate naltrexone 25 mg daily to assist with cravings.  Continue CIWA protocol.    Disposition:  No evidence of imminent risk to self or others at present.   Patient does not meet criteria for psychiatric  inpatient admission. Supportive therapy provided about ongoing stressors. Refer to IOP. Discussed crisis plan, support from social network, calling 911, coming to the Emergency Department, and calling Suicide Hotline.  Norma Fredrickson, NP 12/11/2022 4:11  PM

## 2022-12-11 NOTE — TOC Progression Note (Signed)
Transition of Care [facility]) - Progression Note    Patient Details  Name: Billy Wood MRN: [id number] Date of Birth: Jan 22, 1981  [facility] CM/SW Contact  Marlowe Sax, RN Phone Number: 12/11/2022, 10:11 AM  Clinical Narrative:    The patient is from home with his spouse, wife reports HX of alcohol induced seizures Cocaine use 2 weeks ago Substance abuse resources placed on the chart to print at DC for the patient No additional Needs identified, TOC signing off   Expected Discharge Plan: Home/Self Care Barriers to Discharge: Continued Medical Work up  Expected Discharge Plan and Services   Discharge Planning Services: CM Consult   Living arrangements for the past 2 months: Single Family Home                                       Social Determinants of Health (SDOH) Interventions SDOH Screenings   Food Insecurity: Food Insecurity Present (12/10/2022)  Housing: Low Risk  (12/10/2022)  Transportation Needs: No Transportation Needs (12/10/2022)  Utilities: At Risk (12/10/2022)  Tobacco Use: Low Risk  (12/10/2022)    Readmission Risk Interventions     No data to display

## 2022-12-12 ENCOUNTER — Encounter: Payer: Self-pay | Admitting: Internal Medicine

## 2022-12-12 DIAGNOSIS — F1910 Other psychoactive substance abuse, uncomplicated: Secondary | ICD-10-CM | POA: Diagnosis present

## 2022-12-12 HISTORY — DX: Other disorders of phosphorus metabolism: E83.39

## 2022-12-15 ENCOUNTER — Other Ambulatory Visit: Payer: Self-pay | Admitting: Nurse Practitioner

## 2022-12-15 MED ORDER — OMEPRAZOLE MAGNESIUM 20 MG PO TBEC: 40.0000 mg | DELAYED_RELEASE_TABLET | Freq: Every day | ORAL | Status: DC

## 2022-12-15 NOTE — Progress Notes (Signed)
HAVE SENT REQUEST FOR OTC OMEPRAZOLE TO PT PHARMACY

## 2022-12-15 NOTE — Progress Notes (Signed)
Protonix both brand and generic in prescription form not reimbursed by insurance. Other PPIs reviewed and again, not covered. Will recommend OTC to pt and PCP-unclear if pt has My Chart or PCP

## 2023-01-10 DIAGNOSIS — Z419 Encounter for procedure for purposes other than remedying health state, unspecified: Secondary | ICD-10-CM | POA: Diagnosis not present

## 2023-02-09 DIAGNOSIS — Z419 Encounter for procedure for purposes other than remedying health state, unspecified: Secondary | ICD-10-CM | POA: Diagnosis not present

## 2023-02-16 ENCOUNTER — Encounter: Payer: Self-pay | Admitting: Family Medicine

## 2023-02-16 ENCOUNTER — Ambulatory Visit (INDEPENDENT_AMBULATORY_CARE_PROVIDER_SITE_OTHER): Payer: BLUE CROSS/BLUE SHIELD | Admitting: Family Medicine

## 2023-02-16 VITALS — BP 100/70 | HR 98 | Ht 72.0 in | Wt 164.0 lb

## 2023-02-16 DIAGNOSIS — G600 Hereditary motor and sensory neuropathy: Secondary | ICD-10-CM

## 2023-02-16 DIAGNOSIS — F1020 Alcohol dependence, uncomplicated: Secondary | ICD-10-CM | POA: Diagnosis not present

## 2023-02-16 DIAGNOSIS — N529 Male erectile dysfunction, unspecified: Secondary | ICD-10-CM | POA: Diagnosis not present

## 2023-02-16 MED ORDER — SILDENAFIL CITRATE 100 MG PO TABS
100.0000 mg | ORAL_TABLET | ORAL | 2 refills | Status: DC | PRN
Start: 2023-02-16 — End: 2023-05-20

## 2023-02-16 MED ORDER — THIAMINE HCL 100 MG PO TABS
100.0000 mg | ORAL_TABLET | Freq: Every day | ORAL | 3 refills | Status: DC
Start: 2023-02-16 — End: 2024-01-14

## 2023-02-16 NOTE — Assessment & Plan Note (Signed)
Chronic condition with prior treatment involving sildenafil.  Will plan for risk stratification labs and will Rx sildenafil over the interim.

## 2023-02-16 NOTE — Assessment & Plan Note (Signed)
Known history, bilateral foot symptoms intermittently, family history of the same.

## 2023-02-16 NOTE — Assessment & Plan Note (Signed)
Patient presents for establishment of care following discharge from [facility] where he was admitted from 12/09/2022 - 12/12/2022 for alcohol use disorder and alcohol withdrawal related seizure.  Since discharge she has remained abstinent from alcohol, continues with AA, dosed naltrexone x 1 month and since being off of this medication has noted continued abstinence.  Cardiopulmonary findings are benign with positive S1-S2, regular rate and rhythm, no additional heart sounds, lung fields are clear to auscultation bilaterally without wheezes, rales, rhonchi.  Abdomen soft, nontender, nondistended, no hepatosplenomegaly appreciated, normoactive bowel sounds, no varicosities.  We discussed the multifactorial nature of alcohol use disorder, methods to maintain abstinence and both pharmacologic and nonpharmacologic management strategies.  Plan as follows: - Maintain AA visits - Can consider restart of naloxone if symptoms arise - Maintain follow-up for physical

## 2023-02-16 NOTE — Progress Notes (Signed)
     Primary Care / Sports Medicine Office Visit  Patient Information:  Patient ID: Billy Wood, male DOB: 28-Oct-1980 Age: 42 y.o. MRN: [id number]   Billy Wood is a pleasant 42 y.o. male presenting with the following:  Chief Complaint  Patient presents with   Medication Refill    Refill vitamin B1    Vitals:   02/16/23 1535  BP: 100/70  Pulse: 98  SpO2: 98%   Vitals:   02/16/23 1535  Weight: 164 lb (74.4 kg)  Height: 6' (1.829 m)   Body mass index is 22.24 kg/m.  No results found.   Independent interpretation of notes and tests performed by another provider:   None  Procedures performed:   None  Pertinent History, Exam, Impression, and Recommendations:   Billy Wood was seen today for medication refill.  Charcot-Marie-Tooth disease Assessment & Plan: Known history, bilateral foot symptoms intermittently, family history of the same.   Erectile disorder Assessment & Plan: Chronic condition with prior treatment involving sildenafil.  Will plan for risk stratification labs and will Rx sildenafil over the interim.  Orders: -     Sildenafil Citrate; Take 1 tablet (100 mg total) by mouth as needed for erectile dysfunction (for use prior to sexual activity).  Dispense: 30 tablet; Refill: 2  Alcohol use disorder, severe, dependence (HCC) Assessment & Plan: Patient presents for establishment of care following discharge from [facility] where he was admitted from 12/09/2022 - 12/12/2022 for alcohol use disorder and alcohol withdrawal related seizure.  Since discharge she has remained abstinent from alcohol, continues with AA, dosed naltrexone x 1 month and since being off of this medication has noted continued abstinence.  Cardiopulmonary findings are benign with positive S1-S2, regular rate and rhythm, no additional heart sounds, lung fields are clear to auscultation bilaterally without wheezes, rales, rhonchi.  Abdomen soft, nontender, nondistended, no  hepatosplenomegaly appreciated, normoactive bowel sounds, no varicosities.  We discussed the multifactorial nature of alcohol use disorder, methods to maintain abstinence and both pharmacologic and nonpharmacologic management strategies.  Plan as follows: - Maintain AA visits - Can consider restart of naloxone if symptoms arise - Maintain follow-up for physical   Orders: -     Thiamine HCl; Take 1 tablet (100 mg total) by mouth daily.  Dispense: 30 tablet; Refill: 3   I provided a total time of 60 minutes including both face-to-face and non-face-to-face time on 02/16/2023 inclusive of time utilized for medical chart review, information gathering, care coordination with staff, and documentation completion.   Orders & Medications Meds ordered this encounter  Medications   sildenafil (VIAGRA) 100 MG tablet    Sig: Take 1 tablet (100 mg total) by mouth as needed for erectile dysfunction (for use prior to sexual activity).    Dispense:  30 tablet    Refill:  2   thiamine (VITAMIN B1) 100 MG tablet    Sig: Take 1 tablet (100 mg total) by mouth daily.    Dispense:  30 tablet    Refill:  3   No orders of the defined types were placed in this encounter.    No follow-ups on file.     Jerrol Banana, MD, [facility]   Primary Care Sports Medicine Primary Care and Sports Medicine at [facility]

## 2023-02-23 ENCOUNTER — Ambulatory Visit (INDEPENDENT_AMBULATORY_CARE_PROVIDER_SITE_OTHER): Payer: BLUE CROSS/BLUE SHIELD | Admitting: Family Medicine

## 2023-02-23 ENCOUNTER — Encounter: Payer: Self-pay | Admitting: Family Medicine

## 2023-02-23 VITALS — BP 118/78 | HR 78 | Ht 71.0 in | Wt 165.0 lb

## 2023-02-23 DIAGNOSIS — M545 Low back pain, unspecified: Secondary | ICD-10-CM | POA: Diagnosis not present

## 2023-02-23 DIAGNOSIS — G8929 Other chronic pain: Secondary | ICD-10-CM

## 2023-02-23 DIAGNOSIS — N529 Male erectile dysfunction, unspecified: Secondary | ICD-10-CM | POA: Diagnosis not present

## 2023-02-23 DIAGNOSIS — M542 Cervicalgia: Secondary | ICD-10-CM | POA: Diagnosis not present

## 2023-02-23 DIAGNOSIS — H9313 Tinnitus, bilateral: Secondary | ICD-10-CM | POA: Diagnosis not present

## 2023-02-23 MED ORDER — CYCLOBENZAPRINE HCL 10 MG PO TABS
10.0000 mg | ORAL_TABLET | Freq: Every evening | ORAL | 0 refills | Status: DC | PRN
Start: 2023-02-23 — End: 2023-05-20

## 2023-02-23 MED ORDER — DICLOFENAC SODIUM 75 MG PO TBEC
75.0000 mg | DELAYED_RELEASE_TABLET | Freq: Two times a day (BID) | ORAL | 0 refills | Status: DC | PRN
Start: 2023-02-23 — End: 2023-05-20

## 2023-02-23 NOTE — Progress Notes (Signed)
Primary Care / Sports Medicine Office Visit  Patient Information:  Patient ID: Billy Wood, male DOB: 01/11/1981 Age: 42 y.o. MRN: [id number]   Billy Wood is a pleasant 42 y.o. male presenting with the following:  Chief Complaint  Patient presents with   Tinnitus    3 weeks, ringing   Numbness    Hands and feet at night    Vitals:   02/23/23 1510  BP: 118/78  Pulse: 78  SpO2: 98%   Vitals:   02/23/23 1510  Weight: 165 lb (74.8 kg)  Height: 5\' 11"  (1.803 m)   Body mass index is 23.01 kg/m.  No results found.   Independent interpretation of notes and tests performed by another provider:   None  Procedures performed:   None  Pertinent History, Exam, Impression, and Recommendations:   Billy Wood was seen today for tinnitus and numbness.  Tinnitus of both ears Assessment & Plan: Chronic condition, acute episodic worsening, most recently noted over the past 3 weeks.  Denies any specific exposures but has been ramping up physical activity with regular gym attendance over the past 3-4 weeks.  Gross hearing is intact, nontender about the TMJ, tympanic membranes and canals benign bilaterally, no lymphadenopathy, oropharynx nasopharynx benign.  We reviewed the various etiologies behind his symptoms, musculoskeletal versus in her ear, etc., plan as follows: - Referral to ENT - [facility] management, see additional assessment(s) for plan details.  Orders: -     Ambulatory referral to ENT  Cervicalgia Assessment & Plan: Acute on chronic, most recent episode after starting aggressive gym-based exercise program, nighttime paresthesias in the upper and lower extremities.  No significant interference with ADLs.  Exam with mildly limited cervical range of motion localizing central and to the left, equivocal left-sided Spurling's, equivocal Tinel's and Phalen's, otherwise sensorimotor intact in the upper and lower extremities with remainder of provocative testing  benign.  Etiologies can include underlying primary cervical versus lumbosacral intervertebral disorder with associated neural impingement, can also consider carpal tunnel.  Plan as follows: - Start home based exercise program for spinal conditioning - Can dose diclofenac twice a day as needed - Can dose nightly cyclobenzaprine as needed for muscle tightness, side effect and be drowsiness - Contact us if symptoms persist without improvement to discuss next steps - Can consider imaging, formal PT, medication modification such as introduction of gabapentin  Orders: -     Diclofenac Sodium; Take 1 tablet (75 mg total) by mouth 2 (two) times daily as needed.  Dispense: 60 tablet; Refill: 0 -     Cyclobenzaprine HCl; Take 1 tablet (10 mg total) by mouth at bedtime as needed for muscle spasms.  Dispense: 30 tablet; Refill: 0  Lumbosacral pain, chronic Assessment & Plan: See additional assessment(s) for plan details.  Orders: -     Diclofenac Sodium; Take 1 tablet (75 mg total) by mouth 2 (two) times daily as needed.  Dispense: 60 tablet; Refill: 0 -     Cyclobenzaprine HCl; Take 1 tablet (10 mg total) by mouth at bedtime as needed for muscle spasms.  Dispense: 30 tablet; Refill: 0  Erectile disorder Assessment & Plan: Persistent symptoms despite sildenafil 100 mg as needed, length of time discussing various etiologies.  Plan as follows: - Continue sildenafil 100 mg as-needed - Continue regular exercise as you have been doing - Make healthy dietary changes where able to do so - Review information attached      Orders & Medications Meds ordered this encounter  Medications   diclofenac (VOLTAREN) 75 MG EC tablet    Sig: Take 1 tablet (75 mg total) by mouth 2 (two) times daily as needed.    Dispense:  60 tablet    Refill:  0   cyclobenzaprine (FLEXERIL) 10 MG tablet    Sig: Take 1 tablet (10 mg total) by mouth at bedtime as needed for muscle spasms.    Dispense:  30 tablet     Refill:  0   Orders Placed This Encounter  Procedures   Ambulatory referral to ENT     No follow-ups on file.     Billy Banana, MD, [facility]   Primary Care Sports Medicine Primary Care and Sports Medicine at [facility]

## 2023-02-23 NOTE — Assessment & Plan Note (Signed)
 See additional assessment(s) for plan details. 

## 2023-02-23 NOTE — Assessment & Plan Note (Signed)
Persistent symptoms despite sildenafil 100 mg as needed, length of time discussing various etiologies.  Plan as follows: - Continue sildenafil 100 mg as-needed - Continue regular exercise as you have been doing - Make healthy dietary changes where able to do so - Review information attached

## 2023-02-23 NOTE — Assessment & Plan Note (Signed)
Acute on chronic, most recent episode after starting aggressive gym-based exercise program, nighttime paresthesias in the upper and lower extremities.  No significant interference with ADLs.  Exam with mildly limited cervical range of motion localizing central and to the left, equivocal left-sided Spurling's, equivocal Tinel's and Phalen's, otherwise sensorimotor intact in the upper and lower extremities with remainder of provocative testing benign.  Etiologies can include underlying primary cervical versus lumbosacral intervertebral disorder with associated neural impingement, can also consider carpal tunnel.  Plan as follows: - Start home based exercise program for spinal conditioning - Can dose diclofenac twice a day as needed - Can dose nightly cyclobenzaprine as needed for muscle tightness, side effect and be drowsiness - Contact us if symptoms persist without improvement to discuss next steps - Can consider imaging, formal PT, medication modification such as introduction of gabapentin

## 2023-02-23 NOTE — Assessment & Plan Note (Signed)
Chronic condition, acute episodic worsening, most recently noted over the past 3 weeks.  Denies any specific exposures but has been ramping up physical activity with regular gym attendance over the past 3-4 weeks.  Gross hearing is intact, nontender about the TMJ, tympanic membranes and canals benign bilaterally, no lymphadenopathy, oropharynx nasopharynx benign.  We reviewed the various etiologies behind his symptoms, musculoskeletal versus in her ear, etc., plan as follows: - Referral to ENT - [facility] management, see additional assessment(s) for plan details.

## 2023-02-23 NOTE — Patient Instructions (Addendum)
-   Start home based exercise program for spinal conditioning - Can dose diclofenac twice a day as needed - Can dose nightly cyclobenzaprine as needed for muscle tightness, side effect and be drowsiness - Contact us if symptoms persist without improvement to discuss next steps - Continue sildenafil 100 mg as-needed - Continue regular exercise as you have been doing - Make healthy dietary changes where able to do so - Review information attached

## 2023-03-12 DIAGNOSIS — Z419 Encounter for procedure for purposes other than remedying health state, unspecified: Secondary | ICD-10-CM | POA: Diagnosis not present

## 2023-03-22 ENCOUNTER — Other Ambulatory Visit: Payer: Self-pay | Admitting: Family Medicine

## 2023-03-22 DIAGNOSIS — G8929 Other chronic pain: Secondary | ICD-10-CM

## 2023-03-22 DIAGNOSIS — M542 Cervicalgia: Secondary | ICD-10-CM

## 2023-03-24 NOTE — Telephone Encounter (Signed)
Requested medication (s) are due for refill today: routing for review  Requested medication (s) are on the active medication list: yes  Last refill:  02/23/23  Future visit scheduled: yes  Notes to clinic:  Unable to refill per protocol, should patient continue to take? Routing for approval.      Requested Prescriptions  Pending Prescriptions Disp Refills   diclofenac (VOLTAREN) 75 MG EC tablet [Pharmacy Med Name: DICLOFENAC SODIUM 75MG  DR TABLETS] 60 tablet 0    Sig: TAKE 1 TABLET(75 MG) BY MOUTH TWICE DAILY AS NEEDED     Analgesics:  NSAIDS Failed - 03/22/2023  3:39 AM      Failed - Manual Review: Labs are only required if the patient has taken medication for more than 8 weeks.      Failed - Cr in normal range and within 360 days    Creatinine, Ser  Date Value Ref Range Status  12/09/2022 0.59 (L) 0.61 - 1.24 mg/dL Final         Failed - PLT in normal range and within 360 days    Platelets  Date Value Ref Range Status  12/09/2022 68 (L) 150 - 400 K/uL Final    Comment:    Immature Platelet Fraction may be clinically indicated, consider ordering this additional test [id number] PLATELET COUNT CONFIRMED BY SMEAR          Failed - HCT in normal range and within 360 days    HCT  Date Value Ref Range Status  12/09/2022 34.9 (L) 39.0 - 52.0 % Final         Passed - HGB in normal range and within 360 days    Hemoglobin  Date Value Ref Range Status  12/09/2022 13.1 13.0 - 17.0 g/dL Final         Passed - eGFR is 30 or above and within 360 days    GFR, Estimated  Date Value Ref Range Status  12/09/2022 >60 >60 mL/min Final    Comment:    (NOTE) Calculated using the CKD-EPI Creatinine Equation (2021)          Passed - Patient is not pregnant      Passed - Valid encounter within last 12 months    Recent Outpatient Visits           4 weeks ago Tinnitus of both ears   [place] Primary [facility] Medicine at [facility] Ashley Royalty, Ocie Bob, MD   1 month  ago Alcohol use disorder, severe, dependence [facility])   [place] Primary Care & Sports Medicine at [facility], Ocie Bob, MD       Future Appointments             In 1 month Ashley Royalty, Ocie Bob, MD [facility] Primary Care & Sports Medicine at [facility], [facility]

## 2023-04-12 DIAGNOSIS — Z419 Encounter for procedure for purposes other than remedying health state, unspecified: Secondary | ICD-10-CM | POA: Diagnosis not present

## 2023-04-29 ENCOUNTER — Other Ambulatory Visit: Payer: Self-pay | Admitting: Family Medicine

## 2023-04-29 DIAGNOSIS — G8929 Other chronic pain: Secondary | ICD-10-CM

## 2023-04-29 DIAGNOSIS — M542 Cervicalgia: Secondary | ICD-10-CM

## 2023-05-01 NOTE — Telephone Encounter (Signed)
He's coming in on 10/9 for a physical. Does he need something sooner?

## 2023-05-01 NOTE — Telephone Encounter (Signed)
Requested medications are due for refill today.  yes  Requested medications are on the active medications list.  yes  Last refill. 02/23/2023 - 30 day supply  Future visit scheduled.   yes  Notes to clinic.  Laren Billy Wood listed as PCP.    Requested Prescriptions  Pending Prescriptions Disp Refills   diclofenac (VOLTAREN) 75 MG EC tablet [Pharmacy Med Name: DICLOFENAC SODIUM 75MG  DR TABLETS] 60 tablet 0    Sig: TAKE 1 TABLET(75 MG) BY MOUTH TWICE DAILY AS NEEDED     Analgesics:  NSAIDS Failed - 04/29/2023  7:34 PM      Failed - Manual Review: Labs are only required if the patient has taken medication for more than 8 weeks.      Failed - Cr in normal range and within 360 days    Creatinine, Ser  Date Value Ref Range Status  12/09/2022 0.59 (L) 0.61 - 1.24 mg/dL Final         Failed - PLT in normal range and within 360 days    Platelets  Date Value Ref Range Status  12/09/2022 68 (L) 150 - 400 K/uL Final    Comment:    Immature Platelet Fraction may be clinically indicated, consider ordering this additional test [id number] PLATELET COUNT CONFIRMED BY SMEAR          Failed - HCT in normal range and within 360 days    HCT  Date Value Ref Range Status  12/09/2022 34.9 (L) 39.0 - 52.0 % Final         Passed - HGB in normal range and within 360 days    Hemoglobin  Date Value Ref Range Status  12/09/2022 13.1 13.0 - 17.0 g/dL Final         Passed - eGFR is 30 or above and within 360 days    GFR, Estimated  Date Value Ref Range Status  12/09/2022 >60 >60 mL/min Final    Comment:    (NOTE) Calculated using the CKD-EPI Creatinine Equation (2021)          Passed - Patient is not pregnant      Passed - Valid encounter within last 12 months    Recent Outpatient Visits           2 months ago Tinnitus of both ears   [place] [facility] Medicine at [facility] Ashley Royalty, Ocie Bob, MD   2 months ago Alcohol use disorder, severe, dependence [facility])    [place] [facility] Medicine at [facility], Ocie Bob, MD       Future Appointments             In 2 weeks Ashley Royalty, Ocie Bob, MD [facility] Primary Care & Sports Medicine at [facility], [facility]             cyclobenzaprine (FLEXERIL) 10 MG tablet [Pharmacy Med Name: CYCLOBENZAPRINE 10MG  TABLETS] 30 tablet 0    Sig: TAKE 1 TABLET(10 MG) BY MOUTH AT BEDTIME AS NEEDED FOR MUSCLE SPASMS     Not Delegated - Analgesics:  Muscle Relaxants Failed - 04/29/2023  7:34 PM      Failed - This refill cannot be delegated      Passed - Valid encounter within last 6 months    Recent Outpatient Visits           2 months ago Tinnitus of both ears   [place] Primary Care & Sports Medicine at [facility]  Jerrol Banana, MD   2 months ago Alcohol use disorder, severe, dependence [facility])   [place] Primary Care & Sports Medicine at [facility], Ocie Bob, MD       Future Appointments             In 2 weeks Ashley Royalty, Ocie Bob, MD [facility] Primary Care & Sports Medicine at [facility], [facility]

## 2023-05-12 DIAGNOSIS — Z419 Encounter for procedure for purposes other than remedying health state, unspecified: Secondary | ICD-10-CM | POA: Diagnosis not present

## 2023-05-20 ENCOUNTER — Ambulatory Visit (INDEPENDENT_AMBULATORY_CARE_PROVIDER_SITE_OTHER): Payer: BLUE CROSS/BLUE SHIELD | Admitting: Family Medicine

## 2023-05-20 ENCOUNTER — Encounter: Payer: Self-pay | Admitting: Family Medicine

## 2023-05-20 VITALS — BP 118/78 | HR 80 | Ht 71.0 in | Wt 183.0 lb

## 2023-05-20 DIAGNOSIS — N529 Male erectile dysfunction, unspecified: Secondary | ICD-10-CM

## 2023-05-20 DIAGNOSIS — Z125 Encounter for screening for malignant neoplasm of prostate: Secondary | ICD-10-CM | POA: Diagnosis not present

## 2023-05-20 DIAGNOSIS — R202 Paresthesia of skin: Secondary | ICD-10-CM

## 2023-05-20 DIAGNOSIS — Z131 Encounter for screening for diabetes mellitus: Secondary | ICD-10-CM | POA: Diagnosis not present

## 2023-05-20 DIAGNOSIS — Z13220 Encounter for screening for lipoid disorders: Secondary | ICD-10-CM | POA: Diagnosis not present

## 2023-05-20 DIAGNOSIS — R200 Anesthesia of skin: Secondary | ICD-10-CM

## 2023-05-20 DIAGNOSIS — Z0000 Encounter for general adult medical examination without abnormal findings: Secondary | ICD-10-CM

## 2023-05-20 DIAGNOSIS — M542 Cervicalgia: Secondary | ICD-10-CM | POA: Diagnosis not present

## 2023-05-20 DIAGNOSIS — E559 Vitamin D deficiency, unspecified: Secondary | ICD-10-CM | POA: Diagnosis not present

## 2023-05-20 DIAGNOSIS — F1020 Alcohol dependence, uncomplicated: Secondary | ICD-10-CM

## 2023-05-20 MED ORDER — DICLOFENAC SODIUM 75 MG PO TBEC: 75.0000 mg | DELAYED_RELEASE_TABLET | Freq: Two times a day (BID) | ORAL | 0 refills | Status: DC | PRN

## 2023-05-20 MED ORDER — CYCLOBENZAPRINE HCL 10 MG PO TABS
10.0000 mg | ORAL_TABLET | Freq: Every evening | ORAL | 0 refills | Status: DC | PRN
Start: 2023-05-20 — End: 2023-06-09

## 2023-05-20 MED ORDER — SILDENAFIL CITRATE 100 MG PO TABS: 100.0000 mg | ORAL_TABLET | ORAL | 11 refills | Status: DC | PRN

## 2023-05-20 NOTE — Assessment & Plan Note (Signed)
RHD, presenting with chronic bilateral hand numbness and tingling waking him up from sleep nightly, resolves with shaking hands and moving around in general, denies any daytime symptoms.   Differential, given cervicalgia and lumbosacral pain history include cervical radiculopathy versus carpal tunnel syndrome given his exam findings today.  Plan: - Restart diclofenac twice daily with food x 2 weeks then dose as-needed - Dose cyclobenzaprine nightly x 2 weeks then take as-needed - Use "carpal tunnel brace / cockup wrist splints" nightly x 4 weeks - Contact us at 4 weeks if symptoms persist - Can use Nervive OTC supplement (or obtain ALA, B-complex, and turmeric)

## 2023-05-20 NOTE — Patient Instructions (Signed)
-   Obtain fasting labs with orders provided (can have water or black coffee but otherwise no food or drink x 8 hours before labs) - Review information provided - Attend eye doctor annually, dentist every 6 months, work towards or maintain 30 minutes of moderate intensity physical activity at least 5 days per week, and consume a balanced diet - Return in 1 year for physical - Contact us for any questions between now and then  Additionally: - Restart diclofenac twice daily with food x 2 weeks then dose as-needed - Dose cyclobenzaprine nightly x 2 weeks then take as-needed - Use "carpal tunnel brace / cockup wrist splints" nightly x 4 weeks - Contact us at 4 weeks if symptoms persist - Can use Nervive OTC supplement (or obtain ALA, B-complex, and turmeric)

## 2023-05-20 NOTE — Progress Notes (Signed)
Annual Physical Exam Visit  Patient Information:  Patient ID: Billy Wood, male DOB: 02-07-1981 Age: 42 y.o. MRN: [id number]   Subjective:   CC: Annual Physical Exam  HPI:  Billy Wood is here for their annual physical.  I reviewed the past medical history, family history, social history, surgical history, and allergies today and changes were made as necessary.  Please see the problem list section below for additional details.  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   Hypomagnesemia 10/28/2022   Hypophosphatemia 12/12/2022   Substance abuse (HCC)    Toxic encephalopathy 12/09/2022   Past Surgical History: Past Surgical History:  Procedure Laterality Date   NO PAST SURGERIES     Family History: Family History  Problem Relation Age of Onset   Alcohol abuse Mother    Charcot-Marie-Tooth disease Mother    Alcohol abuse Father    Charcot-Marie-Tooth disease Son    Allergies: No Known Allergies Health Maintenance: Health Maintenance  Topic Date Due   COVID-19 Vaccine (3 - 2023-24 season) 04/12/2023   INFLUENZA VACCINE  11/09/2023 (Originally 03/12/2023)   Hepatitis C Screening  Completed   HIV Screening  Completed   HPV VACCINES  Aged Out   DTaP/Tdap/Td  Discontinued    HM Colonoscopy     This patient has no relevant Health Maintenance data.      Medications: Current Outpatient Medications on File Prior to Visit  Medication Sig Dispense Refill   Multiple Vitamin (MULTIVITAMIN WITH MINERALS) TABS tablet Take 1 tablet by mouth daily.     thiamine (VITAMIN B1) 100 MG tablet Take 1 tablet (100 mg total) by mouth daily. 30 tablet 3   No current facility-administered medications on file prior to visit.    Objective:   Vitals:   05/20/23 0759  BP: 118/78  Pulse: 80  SpO2: 98%   Vitals:   05/20/23 0759  Weight: 183 lb (83 kg)  Height: 5\' 11"  (1.803 m)   Body  mass index is 25.52 kg/m.  General: Well Developed, well nourished, and in no acute distress.  Neuro: Alert and oriented x3, extra-ocular muscles intact, sensation grossly intact. Cranial nerves II through XII are grossly intact, motor, sensory, and coordinative functions are intact. HEENT: Normocephalic, atraumatic, pupils equal round reactive to light, neck supple, no masses, no lymphadenopathy, thyroid nonpalpable. Oropharynx, nasopharynx, external ear canals are unremarkable. Skin: Warm and dry, no rashes noted.  Cardiac: Regular rate and rhythm, no murmurs rubs or gallops. No peripheral edema. Pulses symmetric. Allen's test negative. Respiratory: Clear to auscultation bilaterally. Not using accessory muscles, speaking in full sentences.  Abdominal: Soft, nontender, nondistended, positive bowel sounds, no masses, no organomegaly. Musculoskeletal: Shoulder, elbow, wrist, hip, knee, ankle stable, and with full range of motion. +Phalen's, -Tinel's at wrist, -Spurling's    Impression and Recommendations:   The patient was counselled, risk factors were discussed, and anticipatory guidance given.  Problem List Items Addressed This Visit       Other   Numbness and tingling in both hands    RHD, presenting with chronic bilateral hand numbness and tingling waking him up from sleep nightly, resolves with shaking hands and moving around in general, denies any daytime symptoms.   Differential, given cervicalgia and lumbosacral pain history include cervical radiculopathy versus carpal tunnel syndrome given his exam findings today.  Plan: - Restart diclofenac twice daily with food x 2 weeks then dose  as-needed - Dose cyclobenzaprine nightly x 2 weeks then take as-needed - Use "carpal tunnel brace / cockup wrist splints" nightly x 4 weeks - Contact us at 4 weeks if symptoms persist - Can use Nervive OTC supplement (or obtain ALA, B-complex, and turmeric)      Relevant Medications   diclofenac  (VOLTAREN) 75 MG EC tablet   cyclobenzaprine (FLEXERIL) 10 MG tablet   Other Relevant Orders   B12 and Folate Panel   Healthcare maintenance - Primary    Annual examination completed, risk stratification labs ordered, anticipatory guidance provided.  We will follow labs once resulted.      Relevant Orders   CBC   Comprehensive metabolic panel   Hemoglobin A1c   Lipid panel   PSA Total (Reflex To Free)   VITAMIN D 25 Hydroxy (Vit-D Deficiency, Fractures)   TSH   B12 and Folate Panel   Erectile disorder   Relevant Medications   sildenafil (VIAGRA) 100 MG tablet   Cervicalgia    See additional assessment(s) for plan details.      Alcohol use disorder, severe, dependence (HCC)    Associated labs ordered given paresthesias discussed.      Relevant Orders   CBC   Comprehensive metabolic panel   P10 and Folate Panel   Other Visit Diagnoses     Screening for lipoid disorders       Relevant Orders   Comprehensive metabolic panel   Lipid panel   Annual physical exam       Relevant Orders   CBC   Comprehensive metabolic panel   Hemoglobin A1c   Lipid panel   PSA Total (Reflex To Free)   VITAMIN D 25 Hydroxy (Vit-D Deficiency, Fractures)   TSH   B12 and Folate Panel   Vitamin D deficiency       Relevant Orders   VITAMIN D 25 Hydroxy (Vit-D Deficiency, Fractures)   Screening for prostate cancer       Relevant Orders   PSA Total (Reflex To Free)   Screening for diabetes mellitus       Relevant Orders   Comprehensive metabolic panel   Hemoglobin A1c        Orders & Medications Medications:  Meds ordered this encounter  Medications   diclofenac (VOLTAREN) 75 MG EC tablet    Sig: Take 1 tablet (75 mg total) by mouth 2 (two) times daily as needed.    Dispense:  60 tablet    Refill:  0   cyclobenzaprine (FLEXERIL) 10 MG tablet    Sig: Take 1 tablet (10 mg total) by mouth at bedtime as needed for muscle spasms.    Dispense:  30 tablet    Refill:  0   sildenafil  (VIAGRA) 100 MG tablet    Sig: Take 1 tablet (100 mg total) by mouth as needed for erectile dysfunction (for use prior to sexual activity).    Dispense:  30 tablet    Refill:  11   Orders Placed This Encounter  Procedures   CBC   Comprehensive metabolic panel   Hemoglobin A1c   Lipid panel   PSA Total (Reflex To Free)   VITAMIN D 25 Hydroxy (Vit-D Deficiency, Fractures)   TSH   B12 and Folate Panel     No follow-ups on file.    Jerrol Banana, MD, [facility]   Primary Care Sports Medicine Primary Care and Sports Medicine at [facility]

## 2023-05-20 NOTE — Assessment & Plan Note (Signed)
See additional assessment(s) for plan details. 

## 2023-05-20 NOTE — Assessment & Plan Note (Signed)
Annual examination completed, risk stratification labs ordered, anticipatory guidance provided.  We will follow labs once resulted. 

## 2023-05-20 NOTE — Assessment & Plan Note (Signed)
Associated labs ordered given paresthesias discussed.

## 2023-06-09 ENCOUNTER — Other Ambulatory Visit: Payer: Self-pay | Admitting: Family Medicine

## 2023-06-09 DIAGNOSIS — R200 Anesthesia of skin: Secondary | ICD-10-CM

## 2023-06-10 NOTE — Telephone Encounter (Signed)
Please review. Ok to send?  KP

## 2023-06-12 DIAGNOSIS — Z419 Encounter for procedure for purposes other than remedying health state, unspecified: Secondary | ICD-10-CM | POA: Diagnosis not present

## 2023-06-12 MED ORDER — DICLOFENAC SODIUM 75 MG PO TBEC
75.0000 mg | DELAYED_RELEASE_TABLET | Freq: Two times a day (BID) | ORAL | 0 refills | Status: DC | PRN
Start: 2023-06-12 — End: 2023-07-15

## 2023-06-12 MED ORDER — CYCLOBENZAPRINE HCL 10 MG PO TABS
10.0000 mg | ORAL_TABLET | Freq: Every evening | ORAL | 0 refills | Status: DC | PRN
Start: 2023-06-12 — End: 2023-08-31

## 2023-07-12 ENCOUNTER — Other Ambulatory Visit: Payer: Self-pay | Admitting: Family Medicine

## 2023-07-12 DIAGNOSIS — Z419 Encounter for procedure for purposes other than remedying health state, unspecified: Secondary | ICD-10-CM | POA: Diagnosis not present

## 2023-07-12 DIAGNOSIS — R200 Anesthesia of skin: Secondary | ICD-10-CM

## 2023-07-15 NOTE — Telephone Encounter (Signed)
Requested Prescriptions  Pending Prescriptions Disp Refills   diclofenac (VOLTAREN) 75 MG EC tablet [Pharmacy Med Name: DICLOFENAC SODIUM 75MG  DR TABLETS] 60 tablet 0    Sig: TAKE 1 TABLET(75 MG) BY MOUTH TWICE DAILY AS NEEDED     Analgesics:  NSAIDS Failed - 07/12/2023  3:40 AM      Failed - Manual Review: Labs are only required if the patient has taken medication for more than 8 weeks.      Failed - Cr in normal range and within 360 days    Creatinine, Ser  Date Value Ref Range Status  12/09/2022 0.59 (L) 0.61 - 1.24 mg/dL Final         Failed - PLT in normal range and within 360 days    Platelets  Date Value Ref Range Status  12/09/2022 68 (L) 150 - 400 K/uL Final    Comment:    Immature Platelet Fraction may be clinically indicated, consider ordering this additional test [id number] PLATELET COUNT CONFIRMED BY SMEAR          Failed - HCT in normal range and within 360 days    HCT  Date Value Ref Range Status  12/09/2022 34.9 (L) 39.0 - 52.0 % Final         Passed - HGB in normal range and within 360 days    Hemoglobin  Date Value Ref Range Status  12/09/2022 13.1 13.0 - 17.0 g/dL Final         Passed - eGFR is 30 or above and within 360 days    GFR, Estimated  Date Value Ref Range Status  12/09/2022 >60 >60 mL/min Final    Comment:    (NOTE) Calculated using the CKD-EPI Creatinine Equation (2021)          Passed - Patient is not pregnant      Passed - Valid encounter within last 12 months    Recent Outpatient Visits           1 month ago Healthcare maintenance   [facility] Primary Care & [facility] Medicine at [facility], Ocie Bob, MD   4 months ago Tinnitus of both ears   [place] [facility] Medicine at [facility], Ocie Bob, MD   4 months ago Alcohol use disorder, severe, dependence [facility])    Primary Care & Sports Medicine at [facility], Ocie Bob, MD       Future Appointments              In 10 months Ashley Royalty, Ocie Bob, MD [facility] Primary Care & Sports Medicine at [facility], [facility]

## 2023-08-12 DIAGNOSIS — Z419 Encounter for procedure for purposes other than remedying health state, unspecified: Secondary | ICD-10-CM | POA: Diagnosis not present

## 2023-08-31 ENCOUNTER — Other Ambulatory Visit: Payer: Self-pay | Admitting: Family Medicine

## 2023-08-31 ENCOUNTER — Telehealth: Payer: BLUE CROSS/BLUE SHIELD | Admitting: Nurse Practitioner

## 2023-08-31 DIAGNOSIS — Z760 Encounter for issue of repeat prescription: Secondary | ICD-10-CM

## 2023-08-31 DIAGNOSIS — R202 Paresthesia of skin: Secondary | ICD-10-CM

## 2023-08-31 MED ORDER — CYCLOBENZAPRINE HCL 10 MG PO TABS
10.0000 mg | ORAL_TABLET | Freq: Every evening | ORAL | 0 refills | Status: DC | PRN
Start: 2023-08-31 — End: 2024-01-14

## 2023-08-31 MED ORDER — DICLOFENAC SODIUM 75 MG PO TBEC
75.0000 mg | DELAYED_RELEASE_TABLET | Freq: Two times a day (BID) | ORAL | 0 refills | Status: DC
Start: 2023-08-31 — End: 2023-10-01

## 2023-08-31 NOTE — Progress Notes (Signed)
Patient requesting refill of Sildenafil   Messaged PCP today to request Volraren and Flexeril and both have been filled  Advised patient they will need to message provider in the same way for sildenafil refill   Patient still has PCP, understands Mychart messaging and will follow up in that way with PCP

## 2023-09-01 ENCOUNTER — Other Ambulatory Visit: Payer: Self-pay | Admitting: Family Medicine

## 2023-09-01 DIAGNOSIS — R202 Paresthesia of skin: Secondary | ICD-10-CM

## 2023-09-01 NOTE — Telephone Encounter (Signed)
Duplicate request, last refilled 08/31/23.  Requested Prescriptions  Pending Prescriptions Disp Refills   cyclobenzaprine (FLEXERIL) 10 MG tablet [Pharmacy Med Name: CYCLOBENZAPRINE 10MG  TABLETS] 30 tablet 0    Sig: TAKE 1 TABLET(10 MG) BY MOUTH AT BEDTIME AS NEEDED FOR MUSCLE SPASMS     Not Delegated - Analgesics:  Muscle Relaxants Failed - 09/01/2023 12:29 PM      Failed - This refill cannot be delegated      Passed - Valid encounter within last 6 months    Recent Outpatient Visits           3 months ago Healthcare maintenance   [facility] at [facility], Billy Bob, Billy Wood   6 months ago Tinnitus of both ears   [place] [facility] at [facility], Billy Bob, Billy Wood   6 months ago Alcohol use disorder, severe, dependence [facility])   [place] Primary Care & Sports Medicine at [facility], Billy Bob, Billy Wood       Future Appointments             In 8 months Ashley Royalty, Billy Bob, Billy Wood [facility] Primary Care & Sports Medicine at [facility], [facility]

## 2023-09-12 DIAGNOSIS — Z419 Encounter for procedure for purposes other than remedying health state, unspecified: Secondary | ICD-10-CM | POA: Diagnosis not present

## 2023-10-01 ENCOUNTER — Other Ambulatory Visit: Payer: Self-pay | Admitting: Family Medicine

## 2023-10-01 DIAGNOSIS — R200 Anesthesia of skin: Secondary | ICD-10-CM

## 2023-10-01 NOTE — Telephone Encounter (Signed)
Requested medication (s) are due for refill today: yes  Requested medication (s) are on the active medication list: yes  Last refill:  08/31/23 #60   Future visit scheduled: yes  Notes to clinic:  PCP listed is Laren Everts NP    Requested Prescriptions  Pending Prescriptions Disp Refills   diclofenac (VOLTAREN) 75 MG EC tablet [Pharmacy Med Name: DICLOFENAC SODIUM 75MG  DR TABLETS] 60 tablet 0    Sig: TAKE 1 TABLET(75 MG) BY MOUTH TWICE DAILY     Analgesics:  NSAIDS Failed - 10/01/2023  3:51 PM      Failed - Manual Review: Labs are only required if the patient has taken medication for more than 8 weeks.      Failed - Cr in normal range and within 360 days    Creatinine, Ser  Date Value Ref Range Status  12/09/2022 0.59 (L) 0.61 - 1.24 mg/dL Final         Failed - PLT in normal range and within 360 days    Platelets  Date Value Ref Range Status  12/09/2022 68 (L) 150 - 400 K/uL Final    Comment:    Immature Platelet Fraction may be clinically indicated, consider ordering this additional test [id number] PLATELET COUNT CONFIRMED BY SMEAR          Failed - HCT in normal range and within 360 days    HCT  Date Value Ref Range Status  12/09/2022 34.9 (L) 39.0 - 52.0 % Final         Passed - HGB in normal range and within 360 days    Hemoglobin  Date Value Ref Range Status  12/09/2022 13.1 13.0 - 17.0 g/dL Final         Passed - eGFR is 30 or above and within 360 days    GFR, Estimated  Date Value Ref Range Status  12/09/2022 >60 >60 mL/min Final    Comment:    (NOTE) Calculated using the CKD-EPI Creatinine Equation (2021)          Passed - Patient is not pregnant      Passed - Valid encounter within last 12 months    Recent Outpatient Visits           4 months ago Healthcare maintenance   [facility] Primary Care & [facility] at [facility], Ocie Bob, MD   7 months ago Tinnitus of both ears   [place] [facility] at  [facility], Ocie Bob, MD   7 months ago Alcohol use disorder, severe, dependence [facility])   [place] Primary Care & Sports Medicine at [facility], Ocie Bob, MD       Future Appointments             In 7 months Ashley Royalty, Ocie Bob, MD [facility] Primary Care & Sports Medicine at [facility], [facility]

## 2023-10-10 DIAGNOSIS — Z419 Encounter for procedure for purposes other than remedying health state, unspecified: Secondary | ICD-10-CM | POA: Diagnosis not present

## 2024-01-14 ENCOUNTER — Encounter: Payer: Self-pay | Admitting: Family Medicine

## 2024-01-14 ENCOUNTER — Ambulatory Visit
Admission: RE | Admit: 2024-01-14 | Discharge: 2024-01-14 | Disposition: A | Discharge status: Home / Self Care | Source: Ambulatory Visit | Attending: Family Medicine

## 2024-01-14 ENCOUNTER — Ambulatory Visit (INDEPENDENT_AMBULATORY_CARE_PROVIDER_SITE_OTHER): Admitting: Family Medicine

## 2024-01-14 ENCOUNTER — Ambulatory Visit
Admission: RE | Admit: 2024-01-14 | Discharge: 2024-01-14 | Disposition: A | Discharge status: Home / Self Care | Attending: Family Medicine | Admitting: Family Medicine

## 2024-01-14 VITALS — BP 108/76 | HR 75 | Ht 71.0 in | Wt 197.0 lb

## 2024-01-14 DIAGNOSIS — N529 Male erectile dysfunction, unspecified: Secondary | ICD-10-CM

## 2024-01-14 DIAGNOSIS — F1020 Alcohol dependence, uncomplicated: Secondary | ICD-10-CM

## 2024-01-14 DIAGNOSIS — R5382 Chronic fatigue, unspecified: Secondary | ICD-10-CM | POA: Diagnosis not present

## 2024-01-14 DIAGNOSIS — D696 Thrombocytopenia, unspecified: Secondary | ICD-10-CM | POA: Diagnosis not present

## 2024-01-14 DIAGNOSIS — Z136 Encounter for screening for cardiovascular disorders: Secondary | ICD-10-CM

## 2024-01-14 DIAGNOSIS — N400 Enlarged prostate without lower urinary tract symptoms: Secondary | ICD-10-CM | POA: Diagnosis not present

## 2024-01-14 DIAGNOSIS — R7309 Other abnormal glucose: Secondary | ICD-10-CM

## 2024-01-14 DIAGNOSIS — R0602 Shortness of breath: Secondary | ICD-10-CM

## 2024-01-14 DIAGNOSIS — Z0000 Encounter for general adult medical examination without abnormal findings: Secondary | ICD-10-CM | POA: Diagnosis not present

## 2024-01-14 MED ORDER — TRAZODONE HCL 50 MG PO TABS: 50.0000 mg | ORAL_TABLET | Freq: Every evening | ORAL | 1 refills | Status: DC | PRN

## 2024-01-14 MED ORDER — ALBUTEROL SULFATE HFA 108 (90 BASE) MCG/ACT IN AERS: 2.0000 | INHALATION_SPRAY | Freq: Four times a day (QID) | RESPIRATORY_TRACT | 2 refills | Status: DC | PRN

## 2024-01-14 NOTE — Assessment & Plan Note (Signed)
 He reports feeling out of breath easily, particularly when climbing stairs or attempting cardio exercises, which he finds unusual. He also experiences lightheadedness, which he associates with his fatigue and shortness of breath.  Shortness of breath Differential includes asthma or seasonal allergies. Discussed albuterol inhaler use to assess symptom improvement. - Prescribe albuterol inhaler for use prior to exercise. - Advise use of Flonase nasal spray if albuterol is ineffective. - Recommend trial of non-sedating antihistamine like Claritin or Zyrtec. - Order chest x-ray.

## 2024-01-14 NOTE — Progress Notes (Signed)
 Annual Physical Exam Visit  Patient Information:  Patient ID: Billy Wood, male DOB: 03/27/1981 Age: 43 y.o. MRN: [id number]   Subjective:   CC: Annual Physical Exam  HPI:  Billy Wood is here for their annual physical.  I reviewed the past medical history, family history, social history, surgical history, and allergies today and changes were made as necessary.  Please see the problem list section below for additional details.  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   Hypertension    Hypomagnesemia 10/28/2022   Hypophosphatemia 12/12/2022   Substance abuse (HCC)    Toxic encephalopathy 12/09/2022   Past Surgical History: Past Surgical History:  Procedure Laterality Date   NO PAST SURGERIES     Family History: Family History  Problem Relation Age of Onset   Alcohol abuse Mother    Charcot-Marie-Tooth disease Mother    Alcohol abuse Father    Charcot-Marie-Tooth disease Son    Allergies: No Known Allergies Health Maintenance: Health Maintenance  Topic Date Due   Pneumococcal Vaccine 62-64 Years old (1 of 2 - PCV) Never done   COVID-19 Vaccine (3 - 2024-25 season) 04/12/2023   INFLUENZA VACCINE  03/11/2024   Hepatitis C Screening  Completed   HIV Screening  Completed   HPV VACCINES  Aged Out   Meningococcal B Vaccine  Aged Out   DTaP/Tdap/Td  Discontinued    HM Colonoscopy   This patient has no relevant Health Maintenance data.    Medications: Current Outpatient Medications on File Prior to Visit  Medication Sig Dispense Refill   CREATINE PO Take by mouth.     Misc Natural Products (NF FORMULAS TESTOSTERONE PO) Take by mouth.     Multiple Vitamin (ONE-A-DAY MENS PO) Take by mouth.     sildenafil  (VIAGRA ) 100 MG tablet Take 1 tablet (100 mg total) by mouth as needed for erectile dysfunction (for use prior to sexual activity). 30 tablet 11   No current  facility-administered medications on file prior to visit.    Objective:   Vitals:   01/14/24 0812  BP: 108/76  Pulse: 75  SpO2: 95%   Vitals:   01/14/24 0812  Weight: 197 lb (89.4 kg)  Height: 5\' 11"  (1.803 m)   Body mass index is 27.48 kg/m.  General: Well Developed, well nourished, and in no acute distress.  Neuro: Alert and oriented x3, extra-ocular muscles intact, sensation grossly intact. Cranial nerves II through XII are grossly intact, motor, sensory, and coordinative functions are intact. HEENT: Normocephalic, atraumatic, neck supple, no masses, no lymphadenopathy, thyroid nonenlarged. Oropharynx, nasopharynx, external ear canals are unremarkable. Skin: Warm and dry, no rashes noted.  Cardiac: Regular rate and rhythm, no murmurs rubs or gallops. No peripheral edema. Pulses symmetric. Respiratory: Clear to auscultation bilaterally. Speaking in full sentences.  Abdominal: Soft, nontender, nondistended, positive bowel sounds, no masses, no organomegaly. Musculoskeletal: Stable, and with full range of motion.  Impression and Recommendations:   The patient was counselled, risk factors were discussed, and anticipatory guidance given.  Problem List Items Addressed This Visit     Alcohol use disorder, severe, dependence (HCC)   Alcohol use disorder Reported sobriety over a year. Anticipated liver function improvement. Commended for maintaining sobriety. - Order comprehensive blood work to assess liver function and overall health.      Chronic fatigue   He experiences significant fatigue, particularly in the afternoons, despite getting approximately  eight hours of sleep each night. He feels 'super tired' after work and gym sessions, which is unusual for him. He has difficulty falling asleep without taking two Equate sleeping pills nightly to aid sleep initiation. Without these, he struggles to fall asleep until about 1 AM. He feels energetic in the mornings but notes a  significant drop in energy by the afternoon.  His partner has not noticed any snoring or pauses in his breathing during sleep. He does not experience nighttime awakenings except to use the bathroom, and he is able to return to sleep easily afterward. No symptoms of sleep apnea such as snoring or observed apneas by his partner.  Sleep disturbances Previous trazodone  use was effective. Discussed restorative sleep and stress impact on sleep quality. - Prescribe trazodone  50 mg, adjust dosage as needed up to maximum of 200 mg nightly. - Refer to sleep medicine specialist. - Advise discontinuation of diphenhydramine, switch to less/non-sedating antihistamine (Allegra, Claritin)  Fatigue Persistent afternoon fatigue possibly related to sleep disturbances and sedating antihistamines. Discussed inadequate deep sleep's impact on energy levels. - Address sleep disturbances as per plan. - Discontinue diphenhydramine, switch to non-sedating antihistamines.      Relevant Orders   CBC   TSH   VITAMIN D  25 Hydroxy (Vit-D Deficiency, Fractures)   Iron, TIBC and Ferritin Panel   Testosterone,Free and Total   Ambulatory referral to Pulmonology   Erectile dysfunction   Erectile dysfunction Managed with sildenafil , effective.  - Continue sildenafil .      Healthcare maintenance - Primary   Annual examination completed, risk stratification labs ordered, anticipatory guidance provided.  We will follow labs once resulted.      Shortness of breath   He reports feeling out of breath easily, particularly when climbing stairs or attempting cardio exercises, which he finds unusual. He also experiences lightheadedness, which he associates with his fatigue and shortness of breath.  Shortness of breath Differential includes asthma or seasonal allergies. Discussed albuterol inhaler use to assess symptom improvement. - Prescribe albuterol inhaler for use prior to exercise. - Advise use of Flonase nasal spray if  albuterol is ineffective. - Recommend trial of non-sedating antihistamine like Claritin or Zyrtec. - Order chest x-ray.      Relevant Medications   albuterol (VENTOLIN HFA) 108 (90 Base) MCG/ACT inhaler   Other Relevant Orders   DG Chest 2 View   Thrombocytopenia (HCC)   Relevant Orders   CBC   Iron, TIBC and Ferritin Panel   Pathologist smear review   Other Visit Diagnoses       Screening for cardiovascular condition       Relevant Orders   Lipid panel     Benign prostatic hyperplasia, unspecified whether lower urinary tract symptoms present       Relevant Orders   PSA Total (Reflex To Free)     Abnormal glucose       Relevant Orders   Comprehensive metabolic panel with GFR   Hemoglobin A1c        Orders & Medications Medications:  Meds ordered this encounter  Medications   albuterol (VENTOLIN HFA) 108 (90 Base) MCG/ACT inhaler    Sig: Inhale 2 puffs into the lungs every 6 (six) hours as needed for wheezing (and before exercise).    Dispense:  8 g    Refill:  2   traZODone  (DESYREL ) 50 MG tablet    Sig: Take 1-4 tablets (50-200 mg total) by mouth at bedtime as needed for sleep.  Dispense:  60 tablet    Refill:  1   Orders Placed This Encounter  Procedures   DG Chest 2 View   CBC   Comprehensive metabolic panel with GFR   Hemoglobin A1c   Lipid panel   PSA Total (Reflex To Free)   TSH   VITAMIN D  25 Hydroxy (Vit-D Deficiency, Fractures)   Iron, TIBC and Ferritin Panel   Pathologist smear review   Testosterone,Free and Total   Ambulatory referral to Pulmonology     Return in about 3 months (around 04/15/2024), or follow-up med issues.    Ma Saupe, MD, [facility]   Primary Care Sports Medicine Primary Care and Sports Medicine at [facility]

## 2024-01-14 NOTE — Assessment & Plan Note (Signed)
 He experiences significant fatigue, particularly in the afternoons, despite getting approximately eight hours of sleep each night. He feels 'super tired' after work and gym sessions, which is unusual for him. He has difficulty falling asleep without taking two Equate sleeping pills nightly to aid sleep initiation. Without these, he struggles to fall asleep until about 1 AM. He feels energetic in the mornings but notes a significant drop in energy by the afternoon.  His partner has not noticed any snoring or pauses in his breathing during sleep. He does not experience nighttime awakenings except to use the bathroom, and he is able to return to sleep easily afterward. No symptoms of sleep apnea such as snoring or observed apneas by his partner.  Sleep disturbances Previous trazodone  use was effective. Discussed restorative sleep and stress impact on sleep quality. - Prescribe trazodone  50 mg, adjust dosage as needed up to maximum of 200 mg nightly. - Refer to sleep medicine specialist. - Advise discontinuation of diphenhydramine, switch to less/non-sedating antihistamine (Allegra, Claritin)  Fatigue Persistent afternoon fatigue possibly related to sleep disturbances and sedating antihistamines. Discussed inadequate deep sleep's impact on energy levels. - Address sleep disturbances as per plan. - Discontinue diphenhydramine, switch to non-sedating antihistamines.

## 2024-01-14 NOTE — Assessment & Plan Note (Signed)
 Annual examination completed, risk stratification labs ordered, anticipatory guidance provided.  We will follow labs once resulted.

## 2024-01-14 NOTE — Assessment & Plan Note (Signed)
 Alcohol use disorder Reported sobriety over a year. Anticipated liver function improvement. Commended for maintaining sobriety. - Order comprehensive blood work to assess liver function and overall health.

## 2024-01-14 NOTE — Assessment & Plan Note (Signed)
 Erectile dysfunction Managed with sildenafil , effective.  - Continue sildenafil .

## 2024-01-14 NOTE — Patient Instructions (Addendum)
-   Obtain fasting labs with orders provided (can have water  or black coffee but otherwise no food or drink x 8 hours before labs) - Review information provided - Attend eye doctor annually, dentist every 6 months, work towards or maintain 30 minutes of moderate intensity physical activity at least 5 days per week, and consume a balanced diet - Return in 1 year for physical - Contact us  for any questions between now and then   Patient Action Plan  1. Alcohol Use Disorder:    - Continue maintaining sobriety.     - Complete comprehensive blood work to check liver function and overall health.  2. Chronic Fatigue and Sleep Disturbances:    - Start taking trazodone  50 mg at night for sleep, adjusting the dose as needed up to 200 mg.    - Stop using diphenhydramine and switch to a non-sedating antihistamine like Allegra or Claritin.    - Follow up with a sleep medicine specialist for further evaluation.  3. Erectile Dysfunction:    - Continue taking sildenafil  as it has been effective.  4. Shortness of Breath:    - Use an albuterol inhaler before exercise to help with breathing.    - If albuterol is not effective, use Flonase nasal spray.    - Try a non-sedating antihistamine like Claritin or Zyrtec.    - Get a chest X-ray as ordered.  5. Additional Tests and Referrals:    - Complete the following blood tests: CBC, TSH, Vitamin D , Iron, TIBC and Ferritin Panel, and Testosterone levels.    - Attend the ambulatory referral to Pulmonology for further assessment.  Red Flags: If you experience severe shortness of breath, chest pain, or any new or worsening symptoms, seek immediate medical attention.

## 2024-01-18 ENCOUNTER — Ambulatory Visit: Payer: Self-pay | Admitting: Family Medicine

## 2024-01-22 ENCOUNTER — Ambulatory Visit
Admission: RE | Admit: 2024-01-22 | Discharge: 2024-01-22 | Disposition: A | Discharge status: Home / Self Care | Source: Ambulatory Visit | Attending: Family Medicine

## 2024-01-22 ENCOUNTER — Ambulatory Visit
Admission: RE | Admit: 2024-01-22 | Discharge: 2024-01-22 | Disposition: A | Discharge status: Home / Self Care | Attending: Family Medicine | Admitting: Family Medicine

## 2024-01-22 ENCOUNTER — Ambulatory Visit (INDEPENDENT_AMBULATORY_CARE_PROVIDER_SITE_OTHER): Admitting: Family Medicine

## 2024-01-22 ENCOUNTER — Encounter: Payer: Self-pay | Admitting: Family Medicine

## 2024-01-22 ENCOUNTER — Telehealth: Payer: Self-pay

## 2024-01-22 VITALS — BP 112/72 | HR 92 | Ht 71.0 in | Wt 194.2 lb

## 2024-01-22 DIAGNOSIS — M7742 Metatarsalgia, left foot: Secondary | ICD-10-CM | POA: Diagnosis not present

## 2024-01-22 DIAGNOSIS — G8929 Other chronic pain: Secondary | ICD-10-CM

## 2024-01-22 DIAGNOSIS — N529 Male erectile dysfunction, unspecified: Secondary | ICD-10-CM

## 2024-01-22 DIAGNOSIS — L84 Corns and callosities: Secondary | ICD-10-CM | POA: Diagnosis not present

## 2024-01-22 DIAGNOSIS — M79672 Pain in left foot: Secondary | ICD-10-CM | POA: Diagnosis not present

## 2024-01-22 DIAGNOSIS — Q6672 Congenital pes cavus, left foot: Secondary | ICD-10-CM | POA: Diagnosis not present

## 2024-01-22 MED ORDER — DICLOFENAC SODIUM 50 MG PO TBEC: 50.0000 mg | DELAYED_RELEASE_TABLET | Freq: Two times a day (BID) | ORAL | 0 refills | Status: DC | PRN

## 2024-01-22 MED ORDER — TADALAFIL 5 MG PO TABS: 5.0000 mg | ORAL_TABLET | Freq: Every day | ORAL | 11 refills | Status: DC

## 2024-01-22 NOTE — Telephone Encounter (Signed)
 Patient cam in today. Has not had labs drawn yet.  JM  Copied from CRM [id number]. Topic: Clinical - Lab/Test Results >> Jan 22, 2024  1:52 PM Lizabeth Riggs wrote: Reason for CRM:  Billy Wood wants a nurse to call him back to discuss his lab results from last week. Please call him at [phone]. Thanks

## 2024-01-22 NOTE — Assessment & Plan Note (Signed)
 Erectile dysfunction Setting suboptimal control with sildenafil .  Discussed alternate options, based on patient's lifestyle, prefers daily prophylactic dosing.  Will transition to daily Cialis. - Start Cialis once daily.

## 2024-01-22 NOTE — Progress Notes (Signed)
 Primary Care / Sports Medicine Office Visit  Patient Information:  Patient ID: Billy Wood, male DOB: 21-Jan-1981 Age: 43 y.o. MRN: [id number]   Billy Wood is a pleasant 43 y.o. male presenting with the following:  Chief Complaint  Patient presents with   Foot Pain    Left foot pain under left pinky toe. Patient heard a pop in foot about a year ago and has been having sharpe pain that comes and goes.     Vitals:   01/22/24 1610  BP: 112/72  Pulse: 92  SpO2: 94%   Vitals:   01/22/24 1610  Weight: 194 lb 3.2 oz (88.1 kg)  Height: 5' 11 (1.803 m)   Body mass index is 27.09 kg/m.     Independent interpretation of notes and tests performed by another provider:   None  Procedures performed:   None  Pertinent History, Exam, Impression, and Recommendations:   Problem List Items Addressed This Visit     Clavus   Relevant Orders   Ambulatory referral to Podiatry   Erectile dysfunction   Erectile dysfunction Setting suboptimal control with sildenafil .  Discussed alternate options, based on patient's lifestyle, prefers daily prophylactic dosing.  Will transition to daily Cialis. - Start Cialis once daily.      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  He experiences excruciating pain on the inside of his foot, which began approximately a year ago. He recalls a 'pop' in the area during a time when he was drinking. The pain is localized and does not radiate up the leg. No swelling is present, but he remembers bruising and black and blue discoloration at the time of the initial injury.  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   He has been trying different shoes to alleviate his symptoms and  mentions a corn on his foot, which he attributes to uneven pressure distribution while walking. The pain is persistent and worsening over time. He is not taking any specific medications for his foot pain but has previously used arch supports, which were effective until he experienced seizures. He is concerned about the corn on his foot, which he feels is getting worse.  No swelling in the foot. No pain radiating up the leg. He reports that he has sensation in the foot, though not full.  Physical Exam PALPATION: Nontender at the peroneal tendons, left foot and ankle, nontender malleoli. Nontender at the base of the fifth metatarsal.  Mildly tender at the fifth MTP.  Primary tenderness at the second and third through fifth dorsum more than plantar aspect of the MTP region.  Assessment and Plan Metatarsalgia - Left Intermittent pain in the dorsum and plantar aspect of the left foot MTP region, likely due to pes cavus contributing. Previous injury may contribute as well.  Cannot exclude CMT involvement. - Order x-ray of left foot given stated history. - Prescribe diclofenac  twice daily as needed for pain and inflammation.  Take this medication with food. - Discussed management options including medications and orthotics. - Shared decision-making on medication use and referral to podiatry for custom orthotics and corn management.  Charcot-Marie-Tooth disease CMT present but not causing pain or affecting activities. Sensation reduced but not absent. Potential contribution to  foot pain and sensation issues discussed. - Follow-up with podiatry.      Relevant Medications   diclofenac  (VOLTAREN ) 50 MG EC tablet   Other Relevant Orders   Ambulatory referral to Podiatry   Pes cavus of left foot   See additional assessment(s) for plan details.      Relevant Orders   Ambulatory referral to Podiatry   Other Visit Diagnoses       Chronic foot pain, left       Relevant Medications   diclofenac   (VOLTAREN ) 50 MG EC tablet   Other Relevant Orders   DG Foot Complete Left   Ambulatory referral to Podiatry        Orders & Medications Medications:  Meds ordered this encounter  Medications   diclofenac  (VOLTAREN ) 50 MG EC tablet    Sig: Take 1 tablet (50 mg total) by mouth 2 (two) times daily as needed.    Dispense:  60 tablet    Refill:  0   tadalafil (CIALIS) 5 MG tablet    Sig: Take 1 tablet (5 mg total) by mouth daily.    Dispense:  30 tablet    Refill:  11   Orders Placed This Encounter  Procedures   DG Foot Complete Left   Ambulatory referral to Podiatry     No follow-ups on file.     Ma Saupe, MD, [facility]   Primary Care Sports Medicine Primary Care and Sports Medicine at [facility]

## 2024-01-22 NOTE — Assessment & Plan Note (Signed)
 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  He experiences excruciating pain on the inside of his foot, which began approximately a year ago. He recalls a 'pop' in the area during a time when he was drinking. The pain is localized and does not radiate up the leg. No swelling is present, but he remembers bruising and black and blue discoloration at the time of the initial injury.  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   He has been trying different shoes to alleviate his symptoms and mentions a corn on his foot, which he attributes to uneven pressure distribution while walking. The pain is persistent and worsening over time. He is not taking any specific medications for his foot pain but has previously used arch supports, which were effective until he experienced seizures. He is concerned about the corn on his foot, which he feels is getting worse.  No swelling in the foot. No pain radiating up the leg. He reports that he has sensation in the foot, though not full.  Physical Exam PALPATION: Nontender at the peroneal tendons, left foot and ankle, nontender malleoli. Nontender at the base of the fifth metatarsal.  Mildly tender at the fifth MTP.  Primary tenderness at the second and third through fifth dorsum more than plantar aspect of the MTP region.  Assessment and Plan Metatarsalgia - Left Intermittent pain in the dorsum and plantar aspect of the left foot MTP region, likely due to pes cavus contributing. Previous injury may contribute as well.  Cannot exclude CMT involvement. - Order x-ray of left foot given stated history. - Prescribe diclofenac  twice daily as needed for pain and inflammation.  Take this medication with food. - Discussed management options including medications and orthotics. - Shared decision-making on  medication use and referral to podiatry for custom orthotics and corn management.  Charcot-Marie-Tooth disease CMT present but not causing pain or affecting activities. Sensation reduced but not absent. Potential contribution to foot pain and sensation issues discussed. - Follow-up with podiatry.

## 2024-01-22 NOTE — Patient Instructions (Signed)
 Patient Action Plan  Clavus (Corn on Foot): - Attend the referral appointment with a podiatrist for evaluation and management of the corn.  Erectile Dysfunction: - Begin taking Cialis (tadalafil) 5 mg once daily as a daily treatment.  Metatarsalgia (Foot Pain): - Obtain an x-ray of your left foot to evaluate the cause of pain. - Take diclofenac  50 mg twice daily as needed for pain and inflammation. Ensure to take it with food to minimize stomach upset. - Consider discussing and using orthotics for better foot support and pain management. - Follow up with the podiatrist for custom orthotics and to address the corn.  Charcot-Marie-Tooth Disease: - Continue monitoring any changes in foot sensation and pain, and discuss these with your podiatrist.  Red Flags: - If you experience increased pain, swelling, or any new symptoms in your foot, or if you have adverse reactions to the medication, contact your healthcare provider immediately.

## 2024-01-22 NOTE — Assessment & Plan Note (Signed)
 See additional assessment(s) for plan details.

## 2024-01-25 ENCOUNTER — Encounter: Payer: Self-pay | Admitting: Family Medicine

## 2024-01-26 ENCOUNTER — Ambulatory Visit: Payer: Self-pay | Admitting: Family Medicine

## 2024-01-26 NOTE — Telephone Encounter (Signed)
 Please review and advise.   JM

## 2024-01-27 ENCOUNTER — Ambulatory Visit: Admitting: Sleep Medicine

## 2024-02-01 ENCOUNTER — Other Ambulatory Visit: Payer: Self-pay

## 2024-02-01 DIAGNOSIS — M7742 Metatarsalgia, left foot: Secondary | ICD-10-CM

## 2024-02-01 MED ORDER — DICLOFENAC SODIUM 50 MG PO TBEC: 50.0000 mg | DELAYED_RELEASE_TABLET | Freq: Two times a day (BID) | ORAL | 0 refills | Status: AC | PRN

## 2024-02-09 ENCOUNTER — Ambulatory Visit (INDEPENDENT_AMBULATORY_CARE_PROVIDER_SITE_OTHER): Admitting: Podiatry

## 2024-02-09 DIAGNOSIS — M21372 Foot drop, left foot: Secondary | ICD-10-CM | POA: Diagnosis not present

## 2024-02-09 DIAGNOSIS — Z820 Family history of epilepsy and other diseases of the nervous system: Secondary | ICD-10-CM

## 2024-02-09 DIAGNOSIS — Q6671 Congenital pes cavus, right foot: Secondary | ICD-10-CM | POA: Diagnosis not present

## 2024-02-09 DIAGNOSIS — M21371 Foot drop, right foot: Secondary | ICD-10-CM

## 2024-02-09 DIAGNOSIS — Q667 Congenital pes cavus, unspecified foot: Secondary | ICD-10-CM

## 2024-02-09 NOTE — Progress Notes (Unsigned)
 Subjective:  Patient ID: Billy Wood, male    DOB: 1981/06/23,  MRN: [id number]  Chief Complaint  Patient presents with   Foot Pain    Pt stated that he has foot drop that is getting worse and he would like to see what could be done about it     43 y.o. male presents with the above complaint.  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  Patient states he has been dealing with this for quite some time.  He does not want any kind of bracing.  He wants to only do orthotics.  He denies seeing anyone else prior to seeing me for this.  He would like to discuss treatment options for this.  Pain scale is 5 out of 10 dull aching nature.  He is experiencing antalgic gait due to the deformity.   Review of Systems: Negative except as noted in the HPI. Denies N/V/F/Ch.  Past Medical History:  Diagnosis Date   Alcohol abuse    Alcohol withdrawal seizure (HCC) 04/08/2021   Hypertension    Hypomagnesemia 10/28/2022   Hypophosphatemia 12/12/2022   Substance abuse (HCC)    Toxic encephalopathy 12/09/2022    Current Outpatient Medications:    albuterol  (VENTOLIN  HFA) 108 (90 Base) MCG/ACT inhaler, Inhale 2 puffs into the lungs every 6 (six) hours as needed for wheezing (and before exercise)., Disp: 8 g, Rfl: 2   CREATINE PO, Take by mouth., Disp: , Rfl:    diclofenac  (VOLTAREN ) 50 MG EC tablet, Take 1 tablet (50 mg total) by mouth 2 (two) times daily as needed., Disp: 60 tablet, Rfl: 0   Misc Natural Products (NF FORMULAS TESTOSTERONE PO), Take by mouth., Disp: , Rfl:    Multiple Vitamin (ONE-A-DAY MENS PO), Take by mouth., Disp: , Rfl:    sildenafil  (VIAGRA ) 100 MG tablet, Take 1 tablet (100 mg total) by mouth as needed for erectile dysfunction (for use prior to sexual activity)., Disp: 30 tablet, Rfl: 11   tadalafil  (CIALIS ) 5 MG tablet, Take 1 tablet (5 mg total) by mouth daily., Disp: 30 tablet, Rfl: 11   traZODone  (DESYREL ) 50 MG tablet, Take 1-4  tablets (50-200 mg total) by mouth at bedtime as needed for sleep., Disp: 60 tablet, Rfl: 1  Social History   Tobacco Use  Smoking Status Never  Smokeless Tobacco Never    No Known Allergies Objective:  There were no vitals filed for this visit. There is no height or weight on file to calculate BMI. Constitutional Well developed. Well nourished.  Vascular Dorsalis pedis pulses palpable bilaterally. Posterior tibial pulses palpable bilaterally. Capillary refill normal to all digits.  No cyanosis or clubbing noted. Pedal hair growth normal.  Neurologic Normal speech. Oriented to person, place, and time. Epicritic sensation to light touch grossly present bilaterally.  Dermatologic Nails well groomed and normal in appearance. No open wounds. No skin lesions.  Orthopedic: Weakness noted to the peroneal and extensor tendons with strength measuring 3 out of 5 bilaterally.  Bilateral pes cavus deformity noted nonreducible.  No weakness noted at the posterior tibial or plantar compartments.   Radiographs: None Assessment:   1. Pes cavus   2. Family history of Charcot-Marie-Tooth disease   3. Bilateral foot-drop    Plan:  Patient was evaluated and treated and all questions answered.  Bilateral pes cavus with underlying history of CMT - All questions and concerns were discussed with the patient in extensive detail I extensively discussed with the  patient her benefit from AFO bracing to address the CMT with associated dropfoot.  However patient did not undergo AFO bracing.  He would like to discuss orthotics mostly for now.  I agreed with the plan if it does not correct the deformity patient will need AFO bracing in the future patient states understanding - Patient was casted for orthotics with valgus heel cup.  No follow-ups on file.   Bilateral pes cavus deformity history of CMT patient does not want AFO brace shoe he wants orthotics

## 2024-02-18 NOTE — Progress Notes (Signed)
  Orthotic order placed will schedule for fitting when in

## 2024-03-18 ENCOUNTER — Telehealth: Payer: Self-pay

## 2024-03-18 ENCOUNTER — Other Ambulatory Visit

## 2024-03-18 NOTE — Telephone Encounter (Signed)
 Pt called and lvm checking on the status of his orthotics. He had an appointment today that he called to cancel earlier in the day (?)I called him back and told him his orthotics are in. He asked what he needed to pay and I told him the bill for his CFO's are $480.68. he stated no one ever told tim that he had to pay for anything. He was very upset and said he doesn't want the orthotics anymore. I told him, because they are custom made, we may not be able to remove the charge. He said well they need to remove it because I don't want it going on my credit and because I don't want them anymore. I told him that id write his name and number down and let someone know.

## 2024-03-18 NOTE — Telephone Encounter (Signed)
 pt called BACK and said he wants the orthotics but he was told by the provider that his insurance covers 1 pr a year. Ill call the insurance company Monday to double check

## 2024-03-18 NOTE — Telephone Encounter (Signed)
 Ms. Orie can you remove the charges please?

## 2024-03-24 ENCOUNTER — Ambulatory Visit (INDEPENDENT_AMBULATORY_CARE_PROVIDER_SITE_OTHER): Admitting: Podiatry

## 2024-03-24 DIAGNOSIS — Q667 Congenital pes cavus, unspecified foot: Secondary | ICD-10-CM

## 2024-03-24 NOTE — Progress Notes (Unsigned)
 Orthotics were dispensed and functioning well no acute complaints.  Break-in period was discussed if any foot and ankle issues on future he will come back and see me.

## 2024-04-15 ENCOUNTER — Ambulatory Visit: Admitting: Family Medicine

## 2024-05-23 ENCOUNTER — Encounter: Payer: Self-pay | Admitting: Family Medicine

## 2024-07-11 ENCOUNTER — Encounter: Payer: Self-pay | Admitting: Family Medicine

## 2024-07-15 ENCOUNTER — Ambulatory Visit: Admitting: Family Medicine

## 2024-07-15 ENCOUNTER — Encounter: Payer: Self-pay | Admitting: Family Medicine

## 2024-07-15 VITALS — BP 120/72 | HR 91 | Ht 71.0 in | Wt 196.0 lb

## 2024-07-15 DIAGNOSIS — N529 Male erectile dysfunction, unspecified: Secondary | ICD-10-CM | POA: Diagnosis not present

## 2024-07-15 DIAGNOSIS — F419 Anxiety disorder, unspecified: Secondary | ICD-10-CM | POA: Diagnosis not present

## 2024-07-15 MED ORDER — SERTRALINE HCL 25 MG PO TABS: 25.0000 mg | ORAL_TABLET | Freq: Every day | ORAL | 0 refills | Status: DC

## 2024-07-15 MED ORDER — TADALAFIL 5 MG PO TABS: 5.0000 mg | ORAL_TABLET | Freq: Every day | ORAL | 11 refills | Status: AC

## 2024-07-15 MED ORDER — SILDENAFIL CITRATE 100 MG PO TABS: 100.0000 mg | ORAL_TABLET | ORAL | 11 refills | Status: AC | PRN

## 2024-07-15 NOTE — Patient Instructions (Signed)
 VISIT SUMMARY:  Today, we discussed your morning anxiety and stress, as well as your erectile dysfunction. We have made some adjustments to your treatment plan to help manage these issues more effectively.  YOUR PLAN:  MALE ERECTILE DYSFUNCTION ASSOCIATED WITH ANXIETY: Your erectile dysfunction is likely due to anxiety and stress rather than a physical issue. -Continue taking tadalafil  at the current dosage. -Engage in regular physical activity, including lower body exercises. -Consider a referral to a urologist if there is no improvement, although you prefer to continue with the current management for now.  GENERALIZED ANXIETY DISORDER: You are experiencing significant morning stress and anxiety, which is affecting your overall well-being. -Start taking Zoloft  (sertraline ) at a low dose. -Follow up in two months to assess how well the medication is working and adjust the dosage if necessary. -Continue with non-medication strategies such as regular exercise, mindfulness, and ensuring you get quality sleep.

## 2024-07-15 NOTE — Progress Notes (Signed)
 Primary Care / Sports Medicine Office Visit  Patient Information:  Patient ID: Billy Wood, male DOB: August 25, 1980 Age: 43 y.o. MRN: [id number]   Billy Wood is a pleasant 43 y.o. male presenting with the following:  Chief Complaint  Patient presents with   Anxiety    Patient presents today for a follow up on anxiety and for refills on medications.    Vitals:   07/15/24 1512  BP: 120/72  Pulse: 91  SpO2: 97%   Vitals:   07/15/24 1512  Weight: 196 lb (88.9 kg)  Height: 5' 11 (1.803 m)   Body mass index is 27.34 kg/m.  No results found.   Discussed the use of AI scribe software for clinical note transcription with the patient, who gave verbal consent to proceed.   Independent interpretation of notes and tests performed by another provider:   None  Procedures performed:   None  Pertinent History, Exam, Impression, and Recommendations:   History of Present Illness Billy Wood is a 43 year old male who presents for medication refills and management of morning anxiety and stress.  Morning anxiety and stress - Significant morning anxiety upon waking, described as feeling 'super stressed' and anxious without a clear reason - Symptoms are persistent and negatively impact overall well-being - Desires to manage anxiety without dependence on medication, but acknowledges need for some form of treatment to improve quality of life - Avoids using alcohol as a coping mechanism for anxiety  Exercise and symptom relief - Engages in regular exercise, which alleviates symptoms such as tingling in the hands  Erectile dysfunction and medication use - Currently taking tadalafil  for erectile dysfunction - Attributes erectile dysfunction primarily to stress and anxiety rather than a physical etiology  Assessment and Plan Male erectile dysfunction  Erectile dysfunction likely due to anxiety and stress, not a physical issue. Current tadalafil  at maximum  dosage. - Continue tadalafil  at current dosage. - Encouraged regular physical activity, including lower body exercises. - Discussed potential urology referral if no improvement, but he prefers current management.  Generalized anxiety disorder Significant morning stress and anxiety. Discussed sympathetic nervous system's role and stress impact on erectile function. He is open to low-dose SSRI despite hesitancy about medication. - Prescribed Zoloft  (sertraline ) at low dose. - Scheduled follow-up in two months to assess medication efficacy and adjust dosage if necessary. - Encouraged non-pharmacological interventions such as regular exercise, mindfulness, and ensuring quality sleep.  Problem List Items Addressed This Visit     Anxiety - Primary   Relevant Medications   sertraline  (ZOLOFT ) 25 MG tablet   Other Visit Diagnoses       Erectile disorder       Relevant Medications   sildenafil  (VIAGRA ) 100 MG tablet   tadalafil  (CIALIS ) 5 MG tablet        Orders & Medications Medications:  Meds ordered this encounter  Medications   sildenafil  (VIAGRA ) 100 MG tablet    Sig: Take 1 tablet (100 mg total) by mouth as needed for erectile dysfunction (for use prior to sexual activity).    Dispense:  30 tablet    Refill:  11   tadalafil  (CIALIS ) 5 MG tablet    Sig: Take 1 tablet (5 mg total) by mouth daily.    Dispense:  30 tablet    Refill:  11   sertraline  (ZOLOFT ) 25 MG tablet    Sig: Take 1 tablet (25 mg total) by mouth daily.    Dispense:  90  tablet    Refill:  0   No orders of the defined types were placed in this encounter.    No follow-ups on file.     Selinda JINNY Ku, MD, [facility]   Primary Care Sports Medicine Primary Care and Sports Medicine at [facility]

## 2024-07-18 ENCOUNTER — Telehealth: Payer: Self-pay | Admitting: Family Medicine

## 2024-07-18 NOTE — Telephone Encounter (Signed)
 Copied from CRM [id number]. Topic: General - Other >> Jul 18, 2024  9:54 AM Aleatha C wrote: Reason for CRM: Patient needs a certification for his  Service dog and wants to know how to go about that please contact him via mychart with information

## 2024-09-12 ENCOUNTER — Ambulatory Visit: Admitting: Family Medicine

## 2024-09-13 ENCOUNTER — Ambulatory Visit: Admitting: Family Medicine

## 2024-09-15 ENCOUNTER — Encounter: Payer: Self-pay | Admitting: Family Medicine

## 2024-09-15 ENCOUNTER — Telehealth: Admitting: Family Medicine

## 2024-09-15 DIAGNOSIS — G4700 Insomnia, unspecified: Secondary | ICD-10-CM | POA: Insufficient documentation

## 2024-09-15 DIAGNOSIS — G629 Polyneuropathy, unspecified: Secondary | ICD-10-CM | POA: Insufficient documentation

## 2024-09-15 DIAGNOSIS — F419 Anxiety disorder, unspecified: Secondary | ICD-10-CM

## 2024-09-15 MED ORDER — SERTRALINE HCL 50 MG PO TABS: 50.0000 mg | ORAL_TABLET | Freq: Every day | ORAL | 0 refills | Status: AC

## 2024-09-15 MED ORDER — TRAZODONE HCL 50 MG PO TABS: 25.0000 mg | ORAL_TABLET | Freq: Every evening | ORAL | 1 refills | Status: AC | PRN

## 2024-09-15 MED ORDER — PREGABALIN 75 MG PO CAPS: 75.0000 mg | ORAL_CAPSULE | Freq: Every evening | ORAL | 0 refills | Status: AC | PRN

## 2024-09-15 MED ORDER — DICLOFENAC SODIUM 75 MG PO TBEC: 75.0000 mg | DELAYED_RELEASE_TABLET | Freq: Two times a day (BID) | ORAL | 0 refills | Status: AC | PRN

## 2024-09-15 NOTE — Assessment & Plan Note (Signed)
 Orders:    sertraline  (ZOLOFT ) 50 MG tablet; Take 1 tablet (50 mg total) by mouth daily.

## 2024-09-15 NOTE — Patient Instructions (Signed)
" °  VISIT SUMMARY: During your visit, we discussed your ongoing neuropathic foot symptoms, anxiety and mood disturbance, insomnia, and nocturia. We adjusted your medications and provided recommendations to help manage these issues.  YOUR PLAN: GENERALIZED ANXIETY DISORDER: Your anxiety has significantly improved with sertraline , and we aim to enhance this improvement. -Increase sertraline  to 50 mg daily. -Use remaining 25 mg tablets (two at a time) until finished, then transition to 50 mg tablets. -Follow up in 1 to 1.5 months to reassess response. -Message if anxiety symptoms worsen.  INSOMNIA: You have chronic insomnia with difficulty falling asleep, which persists despite improved anxiety. -Take half a tablet of trazodone  as needed for sleep. -Increased sertraline  dose may improve sleep over several weeks. -Report any changes in sleep quality.  PERIPHERAL NEUROPATHY: You have chronic peripheral neuropathy with burning and tingling in your feet, recently exacerbated. -Take diclofenac  as needed for neuropathic pain, especially before bedtime or with symptom exacerbation. -Take pregabalin  as needed for neuropathic symptoms, with option to skip if not tolerated. -Follow the low back and core strengthening exercise regimen sent via MyChart for gym use. -Continue vitamin B complex supplementation. -Message if neuropathy symptoms worsen.  NOCTURIA AND URINARY HESITANCY: You experience nocturia and urinary hesitancy with increased nighttime frequency and prolonged urination. -Avoid fluid intake for at least one hour before bedtime and ensure voiding before sleep. -Report persistent symptoms; if ongoing, we will order urinalysis and consider medication to improve urinary flow. Avoid drinking lots of liquids before going to bed or going out. Avoid or reduce how much caffeine or alcohol you drink. Give yourself time when you pee.   Contains text generated by [employer].   "

## 2024-09-15 NOTE — Assessment & Plan Note (Signed)
" °  Orders:   pregabalin  (LYRICA ) 75 MG capsule; Take 1 capsule (75 mg total) by mouth at bedtime as needed.   diclofenac  (VOLTAREN ) 75 MG EC tablet; Take 1 tablet (75 mg total) by mouth 2 (two) times daily as needed.  "

## 2024-09-15 NOTE — Progress Notes (Signed)
 "    Primary Care / Sports Medicine Virtual Visit  Patient Information:  Patient ID: Billy Wood, male DOB: Dec 13, 1980 Age: 44 y.o. MRN: [id number]   Billy Wood is a pleasant 44 y.o. male presenting with the following:  No chief complaint on file.   Review of Systems: No fevers, chills, night sweats, weight loss, chest pain, or shortness of breath.   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   Alcohol use disorder, severe, dependence (HCC) 04/09/2021   Thrombocytopenia    Alcohol use, unspecified with withdrawal, unspecified (HCC) 04/08/2021   Low serum T4 level 05/05/2020   Polyneuropathy, alcoholic 05/01/2020   Hepatic steatosis 02/03/2020   Hypercholesterolemia 01/13/2020   Vitamin B12 deficiency 01/13/2020   Vitamin D  deficiency 01/13/2020   Alcohol abuse, daily use 01/11/2020   Past Medical History:  Diagnosis Date   Alcohol abuse    Alcohol withdrawal seizure (HCC) 04/08/2021   Hypertension    Hypomagnesemia 10/28/2022   Hypophosphatemia 12/12/2022   Substance abuse (HCC)    Toxic encephalopathy 12/09/2022   Outpatient Encounter Medications as of 09/15/2024  Medication Sig   diclofenac  (VOLTAREN ) 75 MG EC tablet Take 1 tablet (75 mg total) by mouth 2 (two) times daily as needed.   pregabalin  (LYRICA ) 75 MG capsule Take 1 capsule (75 mg total) by mouth at bedtime as  needed.   sertraline  (ZOLOFT ) 50 MG tablet Take 1 tablet (50 mg total) by mouth daily.   Misc Natural Products (NF FORMULAS TESTOSTERONE PO) Take by mouth.   sildenafil  (VIAGRA ) 100 MG tablet Take 1 tablet (100 mg total) by mouth as needed for erectile dysfunction (for use prior to sexual activity).   tadalafil  (CIALIS ) 5 MG tablet Take 1 tablet (5 mg total) by mouth daily.   traZODone  (DESYREL ) 50 MG tablet Take 0.5-1 tablets (25-50 mg total) by mouth at bedtime as needed for sleep.   [DISCONTINUED] sertraline  (ZOLOFT ) 25 MG tablet Take 1 tablet (25 mg total) by mouth daily.   No facility-administered encounter medications on file as of 09/15/2024.   Past Surgical History:  Procedure Laterality Date   NO PAST SURGERIES      Discussed the use of AI scribe software for clinical note transcription with the patient, who gave verbal consent to proceed.   Virtual Visit via MyChart Video:   I connected with Billy Wood on 09/15/24 via MyChart Video and verified that I am speaking with the correct person using appropriate identifiers.   The limitations, risks, security and privacy concerns of performing an evaluation and management service by MyChart Video, including the higher likelihood of inaccurate diagnoses and treatments, and the availability of in person appointments were reviewed. The possible need of an additional face-to-face encounter for complete and high quality delivery of care was discussed. The patient was also made aware that there may be a patient responsible charge related to this service. The patient expressed understanding and wishes to  proceed.  Provider location is in medical facility. Patient location is outside their home, different from provider location. People involved in care of the patient during this telehealth encounter were myself, my nurse/medical assistant, and my front office/scheduling team member.  Objective findings:   General: Speaking full sentences,  no audible heavy breathing. Sounds alert and appropriately interactive. Well-appearing. Face symmetric. Extraocular movements intact. Pupils equal and round. No nasal flaring or accessory muscle use visualized.  Independent interpretation of notes and tests performed by another provider:   None  Pertinent History, Exam, Impression, and Recommendations:   Discussed the use of AI scribe software for clinical note transcription with the patient, who gave verbal consent to proceed.  History of Present Illness   Billy Wood is a 44 year old male with polyneuropathy who presents for follow-up of neuropathic foot symptoms and sertraline  therapy.  Neuropathic Foot Symptoms: - Persistent burning and tingling sensations in both feet, ongoing for approximately 18 months - Symptoms have recurred over the past month - Tingling worsens with sedentary activity (sitting or lying down) - Symptoms interfere with ability to rest - No recent alcohol consumption, though symptoms are similar to those previously experienced with alcohol use - Taking vitamin B complex for the past two weeks without significant change in symptoms - Maintains a high protein diet with fruits, vegetables, and water ; no significant dietary changes - No low back pain, tightness, or stiffness  Anxiety and Mood Disturbance: - Taking sertraline  25 mg daily since July 15, 2024, for anxiety - Significant improvement in anxiety and mood since starting sertraline  and after a recent move - No episodes of low mood, irritability, poor energy, poor appetite, or hopelessness in the past two weeks - Previous anxiety affected mood and erectile function - Interested in increasing sertraline  dose for further benefit  Insomnia: - Persistent nightly insomnia characterized by difficulty falling asleep - Previous trial of trazodone  for sleep was excessively sedating and intolerable, even at reduced dose - Currently taking  Norlyda  Nocturia and Urinary Hesitancy: - Nocturia, waking four to five times nightly to urinate - Prolonged time to void - No dysuria or pain - No recent changes in urinary habits aside from increased nocturnal frequency - Previous prostate evaluation in June 2025 - Concern about possible prostate issues, but no additional urinary symptoms       Assessment and Plan    Generalized anxiety disorder Anxiety significantly improved on low-dose sertraline  with reduced PHQ and GAD scores. Symptoms well controlled, no depressive symptoms or functional impairment. Increasing sertraline  may enhance improvement. - Increased sertraline  to 50 mg daily. - Instructed to use remaining 25 mg tablets (two at a time) until finished, then transition to 50 mg tablets. - Scheduled follow-up in 1 to 1.5 months to reassess response. - Advised to message if anxiety symptoms worsen.  Insomnia Chronic insomnia with sleep-onset difficulty persists despite improved anxiety. Previous trazodone  trial too sedating at full dose; half-tablet dose not attempted. Increased sertraline  may improve sleep over weeks. - Prescribed trazodone  with instructions to take half a tablet as needed for sleep. - Advised that increased sertraline  dose may improve sleep over several weeks. - Advised to report any changes in sleep quality.  Peripheral neuropathy Chronic peripheral neuropathy with burning and tingling in feet, recently exacerbated. Symptoms worsen with inactivity and recumbency. No recent alcohol use or low back pain, but possible lumbar nerve compression. - Prescribed diclofenac  as needed for neuropathic pain, especially before bedtime or with symptom exacerbation. -  Prescribed pregabalin  as needed for neuropathic symptoms, with option to skip if not tolerated. - Sent low back and core strengthening exercise regimen via MyChart for gym use. - Advised to continue vitamin B complex supplementation. - Advised to message  if neuropathy symptoms worsen.  Nocturia and urinary hesitancy Nocturia and urinary hesitancy with increased nighttime frequency and prolonged urination, without pain or dysuria. Prostate labs normal. Symptoms may be behavioral or related to sleep disruption. - Provided behavioral recommendations: avoid fluid intake for at least one hour before bedtime and ensure voiding before sleep. - Advised to report persistent symptoms; if ongoing, will order urinalysis and consider medication to improve urinary flow.        Assessment & Plan Anxiety  Orders:   sertraline  (ZOLOFT ) 50 MG tablet; Take 1 tablet (50 mg total) by mouth daily.  Neuropathy  Orders:   pregabalin  (LYRICA ) 75 MG capsule; Take 1 capsule (75 mg total) by mouth at bedtime as needed.   diclofenac  (VOLTAREN ) 75 MG EC tablet; Take 1 tablet (75 mg total) by mouth 2 (two) times daily as needed.  Insomnia, unspecified type  Orders:   traZODone  (DESYREL ) 50 MG tablet; Take 0.5-1 tablets (25-50 mg total) by mouth at bedtime as needed for sleep.   No follow-ups on file.     I discussed the above assessment and treatment plan with the patient. The patient was provided an opportunity to ask questions and all were answered. The patient agreed with the plan and demonstrated an understanding of the instructions.   The patient was advised to call back or seek an in-person evaluation if the symptoms worsen or if the condition fails to improve as anticipated.   I provided a total time of 30 minutes including both face-to-face and non-face-to-face time on 09/15/2024 inclusive of time utilized for medical chart review, information gathering, care coordination with staff, and documentation completion.    Selinda JINNY Ku, MD, [facility]   Primary Care Sports Medicine Primary Care and Sports Medicine at [facility]   "

## 2024-09-15 NOTE — Assessment & Plan Note (Signed)
" °  Orders:   traZODone  (DESYREL ) 50 MG tablet; Take 0.5-1 tablets (25-50 mg total) by mouth at bedtime as needed for sleep.  "
